# Patient Record
Sex: Male | Born: 1965 | Race: White | Hispanic: No | Marital: Single | Smoking: Current every day smoker
Health system: Southern US, Community
[De-identification: ages and names within clinical notes are randomized; demographics above are authoritative.]

## PROBLEM LIST (undated history)

## (undated) DIAGNOSIS — I2510 Atherosclerotic heart disease of native coronary artery without angina pectoris: Secondary | ICD-10-CM

## (undated) DIAGNOSIS — I10 Essential (primary) hypertension: Secondary | ICD-10-CM

## (undated) DIAGNOSIS — I5030 Unspecified diastolic (congestive) heart failure: Secondary | ICD-10-CM

## (undated) DIAGNOSIS — K219 Gastro-esophageal reflux disease without esophagitis: Secondary | ICD-10-CM

## (undated) DIAGNOSIS — Z720 Tobacco use: Secondary | ICD-10-CM

## (undated) DIAGNOSIS — N183 Chronic kidney disease, stage 3 unspecified: Secondary | ICD-10-CM

## (undated) DIAGNOSIS — E785 Hyperlipidemia, unspecified: Secondary | ICD-10-CM

## (undated) DIAGNOSIS — I213 ST elevation (STEMI) myocardial infarction of unspecified site: Secondary | ICD-10-CM

## (undated) HISTORY — DX: Chronic kidney disease, stage 3 unspecified: N18.30

## (undated) HISTORY — DX: Morbid (severe) obesity due to excess calories: E66.01

## (undated) HISTORY — DX: Atherosclerotic heart disease of native coronary artery without angina pectoris: I25.10

## (undated) HISTORY — DX: Unspecified diastolic (congestive) heart failure: I50.30

## (undated) HISTORY — PX: CARDIAC CATHETERIZATION: SHX172

## (undated) HISTORY — DX: Hyperlipidemia, unspecified: E78.5

## (undated) HISTORY — DX: Chronic kidney disease, stage 3 (moderate): N18.3

---

## 2005-01-07 ENCOUNTER — Ambulatory Visit (HOSPITAL_COMMUNITY): Admission: RE | Admit: 2005-01-07 | Discharge: 2005-01-07 | Payer: Self-pay | Admitting: *Deleted

## 2018-03-24 ENCOUNTER — Other Ambulatory Visit: Payer: Self-pay | Admitting: Nephrology

## 2018-03-24 DIAGNOSIS — N183 Chronic kidney disease, stage 3 unspecified: Secondary | ICD-10-CM

## 2018-03-24 DIAGNOSIS — R809 Proteinuria, unspecified: Secondary | ICD-10-CM

## 2018-03-29 ENCOUNTER — Ambulatory Visit
Admission: RE | Admit: 2018-03-29 | Discharge: 2018-03-29 | Disposition: A | Discharge status: Home / Self Care | Payer: BLUE CROSS/BLUE SHIELD | Source: Ambulatory Visit | Attending: Nephrology | Admitting: Nephrology

## 2018-03-29 DIAGNOSIS — N183 Chronic kidney disease, stage 3 unspecified: Secondary | ICD-10-CM

## 2018-03-29 DIAGNOSIS — R809 Proteinuria, unspecified: Secondary | ICD-10-CM

## 2018-04-04 ENCOUNTER — Encounter (HOSPITAL_COMMUNITY): Payer: Self-pay | Admitting: Physician Assistant

## 2018-04-04 ENCOUNTER — Encounter (HOSPITAL_COMMUNITY): Admission: EM | Disposition: A | Discharge status: Home / Self Care | Payer: Self-pay | Attending: Internal Medicine

## 2018-04-04 ENCOUNTER — Inpatient Hospital Stay (HOSPITAL_COMMUNITY)
Admission: EM | Admit: 2018-04-04 | Discharge: 2018-04-06 | DRG: 247 | Disposition: A | Discharge status: Home / Self Care | Payer: BLUE CROSS/BLUE SHIELD | Attending: Internal Medicine | Admitting: Internal Medicine

## 2018-04-04 DIAGNOSIS — N183 Chronic kidney disease, stage 3 (moderate): Secondary | ICD-10-CM | POA: Diagnosis present

## 2018-04-04 DIAGNOSIS — I213 ST elevation (STEMI) myocardial infarction of unspecified site: Secondary | ICD-10-CM | POA: Diagnosis present

## 2018-04-04 DIAGNOSIS — I252 Old myocardial infarction: Secondary | ICD-10-CM | POA: Diagnosis not present

## 2018-04-04 DIAGNOSIS — E785 Hyperlipidemia, unspecified: Secondary | ICD-10-CM | POA: Diagnosis present

## 2018-04-04 DIAGNOSIS — Z720 Tobacco use: Secondary | ICD-10-CM

## 2018-04-04 DIAGNOSIS — I2102 ST elevation (STEMI) myocardial infarction involving left anterior descending coronary artery: Secondary | ICD-10-CM

## 2018-04-04 DIAGNOSIS — I2129 ST elevation (STEMI) myocardial infarction involving other sites: Principal | ICD-10-CM | POA: Diagnosis present

## 2018-04-04 DIAGNOSIS — Z6841 Body Mass Index (BMI) 40.0 and over, adult: Secondary | ICD-10-CM | POA: Diagnosis not present

## 2018-04-04 DIAGNOSIS — I2510 Atherosclerotic heart disease of native coronary artery without angina pectoris: Secondary | ICD-10-CM | POA: Diagnosis present

## 2018-04-04 DIAGNOSIS — I129 Hypertensive chronic kidney disease with stage 1 through stage 4 chronic kidney disease, or unspecified chronic kidney disease: Secondary | ICD-10-CM | POA: Diagnosis present

## 2018-04-04 DIAGNOSIS — I2109 ST elevation (STEMI) myocardial infarction involving other coronary artery of anterior wall: Secondary | ICD-10-CM | POA: Diagnosis not present

## 2018-04-04 DIAGNOSIS — R079 Chest pain, unspecified: Secondary | ICD-10-CM | POA: Diagnosis present

## 2018-04-04 DIAGNOSIS — I10 Essential (primary) hypertension: Secondary | ICD-10-CM

## 2018-04-04 DIAGNOSIS — Z955 Presence of coronary angioplasty implant and graft: Secondary | ICD-10-CM

## 2018-04-04 DIAGNOSIS — E7800 Pure hypercholesterolemia, unspecified: Secondary | ICD-10-CM | POA: Diagnosis not present

## 2018-04-04 HISTORY — DX: ST elevation (STEMI) myocardial infarction of unspecified site: I21.3

## 2018-04-04 HISTORY — PX: LEFT HEART CATH AND CORONARY ANGIOGRAPHY: CATH118249

## 2018-04-04 HISTORY — DX: Essential (primary) hypertension: I10

## 2018-04-04 HISTORY — DX: Tobacco use: Z72.0

## 2018-04-04 HISTORY — PX: CORONARY/GRAFT ACUTE MI REVASCULARIZATION: CATH118305

## 2018-04-04 HISTORY — DX: Gastro-esophageal reflux disease without esophagitis: K21.9

## 2018-04-04 LAB — CBC
HCT: 50 % (ref 39.0–52.0)
HEMOGLOBIN: 16.3 g/dL (ref 13.0–17.0)
MCH: 26.7 pg (ref 26.0–34.0)
MCHC: 32.6 g/dL (ref 30.0–36.0)
MCV: 81.8 fL (ref 78.0–100.0)
PLATELETS: 145 10*3/uL — AB (ref 150–400)
RBC: 6.11 MIL/uL — AB (ref 4.22–5.81)
RDW: 15.5 % (ref 11.5–15.5)
WBC: 11.3 10*3/uL — ABNORMAL HIGH (ref 4.0–10.5)

## 2018-04-04 LAB — POCT I-STAT, CHEM 8
BUN: 24 mg/dL — ABNORMAL HIGH (ref 6–20)
CHLORIDE: 104 mmol/L (ref 101–111)
Calcium, Ion: 1.19 mmol/L (ref 1.15–1.40)
Creatinine, Ser: 1.4 mg/dL — ABNORMAL HIGH (ref 0.61–1.24)
Glucose, Bld: 120 mg/dL — ABNORMAL HIGH (ref 65–99)
HCT: 53 % — ABNORMAL HIGH (ref 39.0–52.0)
Hemoglobin: 18 g/dL — ABNORMAL HIGH (ref 13.0–17.0)
POTASSIUM: 3.8 mmol/L (ref 3.5–5.1)
SODIUM: 141 mmol/L (ref 135–145)
TCO2: 23 mmol/L (ref 22–32)

## 2018-04-04 LAB — COMPREHENSIVE METABOLIC PANEL
ALBUMIN: 3.8 g/dL (ref 3.5–5.0)
ALK PHOS: 114 U/L (ref 38–126)
ALT: 27 U/L (ref 17–63)
AST: 29 U/L (ref 15–41)
Anion gap: 14 (ref 5–15)
BILIRUBIN TOTAL: 0.5 mg/dL (ref 0.3–1.2)
BUN: 22 mg/dL — AB (ref 6–20)
CALCIUM: 9.1 mg/dL (ref 8.9–10.3)
CO2: 20 mmol/L — AB (ref 22–32)
Chloride: 103 mmol/L (ref 101–111)
Creatinine, Ser: 1.45 mg/dL — ABNORMAL HIGH (ref 0.61–1.24)
GFR calc Af Amer: >60 mL/min (ref >60)
GFR calc non Af Amer: 54 mL/min — ABNORMAL LOW (ref >60)
GLUCOSE: 116 mg/dL — AB (ref 65–99)
Potassium: 3.8 mmol/L (ref 3.5–5.1)
SODIUM: 137 mmol/L (ref 135–145)
TOTAL PROTEIN: 7.3 g/dL (ref 6.5–8.1)

## 2018-04-04 LAB — TROPONIN I: Troponin I: 0.21 ng/mL (ref <0.03)

## 2018-04-04 LAB — POCT ACTIVATED CLOTTING TIME: ACTIVATED CLOTTING TIME: 356 s

## 2018-04-04 LAB — PROTIME-INR
INR: 1.01
PROTHROMBIN TIME: 13.2 s (ref 11.4–15.2)

## 2018-04-04 LAB — HEMOGLOBIN A1C
HEMOGLOBIN A1C: 5.4 % (ref 4.8–5.6)
MEAN PLASMA GLUCOSE: 108.28 mg/dL

## 2018-04-04 LAB — LIPID PANEL
CHOL/HDL RATIO: 4.2 ratio
Cholesterol: 139 mg/dL (ref 0–200)
HDL: 33 mg/dL — ABNORMAL LOW (ref >40)
LDL CALC: 87 mg/dL (ref 0–99)
Triglycerides: 96 mg/dL (ref <150)
VLDL: 19 mg/dL (ref 0–40)

## 2018-04-04 LAB — MRSA PCR SCREENING: MRSA by PCR: NEGATIVE

## 2018-04-04 LAB — APTT: aPTT: 30 seconds (ref 24–36)

## 2018-04-04 SURGERY — LEFT HEART CATH AND CORONARY ANGIOGRAPHY
Anesthesia: LOCAL

## 2018-04-04 MED ORDER — HYDRALAZINE HCL 20 MG/ML IJ SOLN: 5.0000 mg | INTRAMUSCULAR | Status: AC | PRN

## 2018-04-04 MED ORDER — ACETAMINOPHEN 325 MG PO TABS
650.0000 mg | ORAL_TABLET | ORAL | Status: DC | PRN
  Administered 2018-04-04 – 2018-04-05 (×2): 650 mg via ORAL
  Filled 2018-04-04 (×2): qty 2

## 2018-04-04 MED ORDER — TIROFIBAN HCL IN NACL 5-0.9 MG/100ML-% IV SOLN
INTRAVENOUS | Status: AC | PRN
  Administered 2018-04-04 (×2): 0.15 ug/kg/min via INTRAVENOUS

## 2018-04-04 MED ORDER — NITROGLYCERIN 1 MG/10 ML FOR IR/CATH LAB
INTRA_ARTERIAL | Status: DC | PRN
  Administered 2018-04-04: 200 ug via INTRACORONARY

## 2018-04-04 MED ORDER — HEPARIN SODIUM (PORCINE) 1000 UNIT/ML IJ SOLN
INTRAMUSCULAR | Status: AC
  Filled 2018-04-04: qty 1

## 2018-04-04 MED ORDER — ASPIRIN 81 MG PO CHEW
81.0000 mg | CHEWABLE_TABLET | Freq: Every day | ORAL | Status: DC
  Administered 2018-04-05 – 2018-04-06 (×2): 81 mg via ORAL
  Filled 2018-04-04 (×2): qty 1

## 2018-04-04 MED ORDER — VERAPAMIL HCL 2.5 MG/ML IV SOLN
INTRAVENOUS | Status: DC | PRN
  Administered 2018-04-04: 10 mL via INTRA_ARTERIAL

## 2018-04-04 MED ORDER — SODIUM CHLORIDE 0.9 % IV SOLN
INTRAVENOUS | Status: AC
  Administered 2018-04-04: 21:00:00 via INTRAVENOUS

## 2018-04-04 MED ORDER — CARVEDILOL 3.125 MG PO TABS
3.1250 mg | ORAL_TABLET | Freq: Two times a day (BID) | ORAL | Status: DC
  Administered 2018-04-04 – 2018-04-06 (×4): 3.125 mg via ORAL
  Filled 2018-04-04 (×4): qty 1

## 2018-04-04 MED ORDER — CARVEDILOL 3.125 MG PO TABS: 3.1250 mg | ORAL_TABLET | Freq: Two times a day (BID) | ORAL | Status: DC

## 2018-04-04 MED ORDER — HEPARIN SODIUM (PORCINE) 1000 UNIT/ML IJ SOLN
INTRAMUSCULAR | Status: DC | PRN
  Administered 2018-04-04: 5000 [IU] via INTRAVENOUS
  Administered 2018-04-04: 7000 [IU] via INTRAVENOUS

## 2018-04-04 MED ORDER — SODIUM CHLORIDE 0.9 % IV SOLN: 250.0000 mL | INTRAVENOUS | Status: DC | PRN

## 2018-04-04 MED ORDER — FENTANYL CITRATE (PF) 100 MCG/2ML IJ SOLN
INTRAMUSCULAR | Status: DC | PRN
  Administered 2018-04-04: 25 ug via INTRAVENOUS
  Administered 2018-04-04: 50 ug via INTRAVENOUS

## 2018-04-04 MED ORDER — FENTANYL CITRATE (PF) 100 MCG/2ML IJ SOLN
INTRAMUSCULAR | Status: AC
  Filled 2018-04-04: qty 2

## 2018-04-04 MED ORDER — SODIUM CHLORIDE 0.9 % IV SOLN
INTRAVENOUS | Status: AC | PRN
  Administered 2018-04-04: 50 mL/h via INTRAVENOUS

## 2018-04-04 MED ORDER — NITROGLYCERIN IN D5W 200-5 MCG/ML-% IV SOLN
INTRAVENOUS | Status: AC
  Filled 2018-04-04: qty 250

## 2018-04-04 MED ORDER — NITROGLYCERIN 1 MG/10 ML FOR IR/CATH LAB
INTRA_ARTERIAL | Status: AC
  Filled 2018-04-04: qty 10

## 2018-04-04 MED ORDER — TICAGRELOR 90 MG PO TABS
ORAL_TABLET | ORAL | Status: AC
  Filled 2018-04-04: qty 2

## 2018-04-04 MED ORDER — LIDOCAINE HCL (PF) 1 % IJ SOLN
INTRAMUSCULAR | Status: DC | PRN
  Administered 2018-04-04: 2 mL via INTRADERMAL

## 2018-04-04 MED ORDER — HEPARIN (PORCINE) IN NACL 2-0.9 UNIT/ML-% IJ SOLN
INTRAMUSCULAR | Status: AC | PRN
  Administered 2018-04-04 (×3): 500 mL

## 2018-04-04 MED ORDER — ATORVASTATIN CALCIUM 80 MG PO TABS
80.0000 mg | ORAL_TABLET | Freq: Every day | ORAL | Status: DC
  Administered 2018-04-05: 80 mg via ORAL
  Filled 2018-04-04: qty 1

## 2018-04-04 MED ORDER — ONDANSETRON HCL 4 MG/2ML IJ SOLN: 4.0000 mg | Freq: Four times a day (QID) | INTRAMUSCULAR | Status: DC | PRN

## 2018-04-04 MED ORDER — TICAGRELOR 90 MG PO TABS
90.0000 mg | ORAL_TABLET | Freq: Two times a day (BID) | ORAL | Status: DC
  Administered 2018-04-05 – 2018-04-06 (×3): 90 mg via ORAL
  Filled 2018-04-04 (×4): qty 1

## 2018-04-04 MED ORDER — MIDAZOLAM HCL 2 MG/2ML IJ SOLN
INTRAMUSCULAR | Status: AC
  Filled 2018-04-04: qty 2

## 2018-04-04 MED ORDER — HEPARIN SODIUM (PORCINE) 5000 UNIT/ML IJ SOLN
5000.0000 [IU] | Freq: Three times a day (TID) | INTRAMUSCULAR | Status: DC
  Administered 2018-04-04 – 2018-04-06 (×5): 5000 [IU] via SUBCUTANEOUS
  Filled 2018-04-04 (×5): qty 1

## 2018-04-04 MED ORDER — LABETALOL HCL 5 MG/ML IV SOLN: 10.0000 mg | INTRAVENOUS | Status: AC | PRN

## 2018-04-04 MED ORDER — TICAGRELOR 90 MG PO TABS
ORAL_TABLET | ORAL | Status: DC | PRN
  Administered 2018-04-04: 180 mg via ORAL

## 2018-04-04 MED ORDER — HEPARIN (PORCINE) IN NACL 2-0.9 UNIT/ML-% IJ SOLN
INTRAMUSCULAR | Status: AC
  Filled 2018-04-04: qty 1000

## 2018-04-04 MED ORDER — SODIUM CHLORIDE 0.9% FLUSH: 3.0000 mL | INTRAVENOUS | Status: DC | PRN

## 2018-04-04 MED ORDER — IOHEXOL 350 MG/ML SOLN
INTRAVENOUS | Status: DC | PRN
  Administered 2018-04-04: 120 mL via INTRA_ARTERIAL

## 2018-04-04 MED ORDER — MIDAZOLAM HCL 2 MG/2ML IJ SOLN
INTRAMUSCULAR | Status: DC | PRN
  Administered 2018-04-04: 1 mg via INTRAVENOUS

## 2018-04-04 MED ORDER — NITROGLYCERIN IN D5W 200-5 MCG/ML-% IV SOLN: 0.0000 ug/min | INTRAVENOUS | Status: DC

## 2018-04-04 MED ORDER — SODIUM CHLORIDE 0.9% FLUSH
3.0000 mL | Freq: Two times a day (BID) | INTRAVENOUS | Status: DC
  Administered 2018-04-04 – 2018-04-05 (×3): 3 mL via INTRAVENOUS

## 2018-04-04 MED ORDER — NITROGLYCERIN IN D5W 200-5 MCG/ML-% IV SOLN
INTRAVENOUS | Status: AC | PRN
  Administered 2018-04-04: 5 ug/min via INTRAVENOUS

## 2018-04-04 MED ORDER — IOPAMIDOL (ISOVUE-370) INJECTION 76%
INTRAVENOUS | Status: AC
  Filled 2018-04-04: qty 125

## 2018-04-04 MED ORDER — VERAPAMIL HCL 2.5 MG/ML IV SOLN
INTRAVENOUS | Status: AC
  Filled 2018-04-04: qty 2

## 2018-04-04 MED ORDER — TIROFIBAN HCL IN NACL 5-0.9 MG/100ML-% IV SOLN
INTRAVENOUS | Status: AC
  Filled 2018-04-04: qty 100

## 2018-04-04 MED ORDER — TIROFIBAN HCL IN NACL 5-0.9 MG/100ML-% IV SOLN: 0.1500 ug/kg/min | INTRAVENOUS | Status: DC

## 2018-04-04 MED ORDER — TIROFIBAN (AGGRASTAT) BOLUS VIA INFUSION
INTRAVENOUS | Status: DC | PRN
  Administered 2018-04-04: 3345 ug via INTRAVENOUS

## 2018-04-04 MED ORDER — IOPAMIDOL (ISOVUE-370) INJECTION 76%
INTRAVENOUS | Status: DC | PRN
  Administered 2018-04-04: 115 mL via INTRA_ARTERIAL

## 2018-04-04 MED ORDER — OXYCODONE HCL 5 MG PO TABS
5.0000 mg | ORAL_TABLET | ORAL | Status: DC | PRN
  Administered 2018-04-05: 5 mg via ORAL
  Administered 2018-04-05: 10 mg via ORAL
  Administered 2018-04-05: 5 mg via ORAL
  Filled 2018-04-04: qty 2
  Filled 2018-04-04: qty 1
  Filled 2018-04-04: qty 2

## 2018-04-04 MED ORDER — LIDOCAINE HCL (PF) 1 % IJ SOLN
INTRAMUSCULAR | Status: AC
  Filled 2018-04-04: qty 30

## 2018-04-04 SURGICAL SUPPLY — 22 items
BALLN SAPPHIRE 2.0X12 (BALLOONS) ×2
BALLOON SAPPHIRE 2.0X12 (BALLOONS) IMPLANT
BAND CMPR LRG ZPHR (HEMOSTASIS)
BAND ZEPHYR COMPRESS 30 LONG (HEMOSTASIS) IMPLANT
CATH INFINITI 5FR ANG PIGTAIL (CATHETERS) ×1 IMPLANT
CATH INFINITI JR4 5F (CATHETERS) ×1 IMPLANT
CATH VISTA GUIDE 6FR XB3.5 (CATHETERS) ×1 IMPLANT
DEVICE RAD COMP TR BAND LRG (VASCULAR PRODUCTS) ×1 IMPLANT
ELECT DEFIB PAD ADLT CADENCE (PAD) ×1 IMPLANT
GUIDEWIRE INQWIRE 1.5J.035X260 (WIRE) IMPLANT
INQWIRE 1.5J .035X260CM (WIRE) ×2
KIT ENCORE 26 ADVANTAGE (KITS) ×1 IMPLANT
KIT HEART LEFT (KITS) ×2 IMPLANT
NDL PERC 21GX4CM (NEEDLE) IMPLANT
NEEDLE PERC 21GX4CM (NEEDLE) ×2 IMPLANT
PACK CARDIAC CATHETERIZATION (CUSTOM PROCEDURE TRAY) ×2 IMPLANT
SHEATH RAIN RADIAL 21G 6FR (SHEATH) ×1 IMPLANT
STENT RESOLUTE ONYX 2.25X12 (Permanent Stent) ×1 IMPLANT
TRANSDUCER W/STOPCOCK (MISCELLANEOUS) ×2 IMPLANT
TUBING CIL FLEX 10 FLL-RA (TUBING) ×2 IMPLANT
WIRE HI TORQ WHISPER MS 190CM (WIRE) ×1 IMPLANT
WIRE RUNTHROUGH .014X180CM (WIRE) ×1 IMPLANT

## 2018-04-04 NOTE — H&P (Addendum)
Cardiology Admission History and Physical:   Patient ID: [name] Schmidt; MRN: [id number]; DOB: Mar 06, 1966   Admission date: 04/04/2018  Primary Care Provider: Nonnie Done., MD Primary Cardiologist: New to Dr. Okey Dupre  Chief Complaint:  Chest pain   Patient Profile:   [name] Schmidt is a 52 y.o. male with a history of tobacco smoking (35 pack year) and recent L shoulder rotater cuff surgery last Thursday presented with Code STEMI.   History of Present Illness:   [name] with Lateral ST elevation with ST depression in inferior leads. Pain started around 1:45pm. Substernal chest pressure. No radiation. No prior chest pain. + dyspnea. No palpitation or syncope.   No hx of HTN or DM. Recent mild kidney injury.    Past Medical History:  Diagnosis Date  . Tobacco abuse     Medications Prior to Admission: Prior to Admission medications   Not on File     Allergies:   No Known Allergies  Social History:   Social History   Socioeconomic History  . Marital status: Single    Spouse name: Not on file  . Number of children: Not on file  . Years of education: Not on file  . Highest education level: Not on file  Occupational History  . Not on file  Social Needs  . Financial resource strain: Not on file  . Food insecurity:    Worry: Not on file    Inability: Not on file  . Transportation needs:    Medical: Not on file    Non-medical: Not on file  Tobacco Use  . Smoking status: Not on file  Substance and Sexual Activity  . Alcohol use: Not on file  . Drug use: Not on file  . Sexual activity: Not on file  Lifestyle  . Physical activity:    Days per week: Not on file    Minutes per session: Not on file  . Stress: Not on file  Relationships  . Social connections:    Talks on phone: Not on file    Gets together: Not on file    Attends religious service: Not on file    Active member of club or organization: Not on file    Attends meetings of clubs or organizations: Not on  file    Relationship status: Not on file  . Intimate partner violence:    Fear of current or ex partner: Not on file    Emotionally abused: Not on file    Physically abused: Not on file    Forced sexual activity: Not on file  Other Topics Concern  . Not on file  Social History Narrative  . Not on file    Family History:   The patient's family history is not on file.   Denies family hx of CAD  ROS:  Please see the history of present illness.  All other ROS reviewed and negative.     Physical Exam/Data:   Vitals:   04/04/18 1646  Weight: 295 lb (133.8 kg)  Height: 5\' 11"  (1.803 m)   No intake or output data in the 24 hours ending 04/04/18 1648 Filed Weights   04/04/18 1646  Weight: 295 lb (133.8 kg)   Body mass index is 41.14 kg/m.  General:  Well nourished, well developed, in no acute distress HEENT: normal Lymph: no adenopathy Neck: no JVD Endocrine:  No thryomegaly Vascular: No carotid bruits; FA pulses 2+ bilaterally without bruits  Cardiac:  normal S1, S2; RRR; no murmur  Lungs:  clear to auscultation bilaterally, no wheezing, rhonchi or rales  Abd: soft, nontender, no hepatomegaly  Ext: no edema Musculoskeletal:  No deformities, BUE and BLE strength normal and equal Skin: warm and dry  Neuro:  CNs 2-12 intact, no focal abnormalities noted Psych:  Normal affect    EKG:  The ECG that was done  was personally reviewed and demonstrates SR with  Lateral ST elevation with ST depression in inferior leads.  Relevant CV Studies: Pending cath   Laboratory Data:  Radiology/Studies:  No results found.  Assessment and Plan:   1. STEMI Pain started around 1:45pm. Still having 6/10 chest pressure. Pending cath.   2. Tobacco abuse - He needs to quit.   Severity of Illness: The appropriate patient status for this patient is INPATIENT. Inpatient status is judged to be reasonable and necessary in order to provide the required intensity of service to ensure the  patient's safety. The patient's presenting symptoms, physical exam findings, and initial radiographic and laboratory data in the context of their chronic comorbidities is felt to place them at high risk for further clinical deterioration. Furthermore, it is not anticipated that the patient will be medically stable for discharge from the hospital within 2 midnights of admission. The following factors support the patient status of inpatient.   " The patient's presenting symptoms include Chest pain. " The worrisome physical exam findings include none " The initial radiographic and laboratory data are worrisome because of STEMI " The chronic co-morbidities include obesity and tobacco abuse   * I certify that at the point of admission it is my clinical judgment that the patient will require inpatient hospital care spanning beyond 2 midnights from the point of admission due to high intensity of service, high risk for further deterioration and high frequency of surveillance required.*    For questions or updates, please contact [facility] Please consult [email or web address] for contact info under Cardiology/STEMI.    Lorelei Pont, [place]  04/04/2018 4:48 PM   I have seen and examined the patient and agree with the findings and plan, as documented in the PA/NP's note, with the following additions/changes.  [name] is a 52 y/o man with history of tobacco abuse, morbid obesity, chronic kidney disease, and recent left rotator cuff surgery, admitted with chest pain since ~1:30 PM and found to have lateral STEMI by EMS.  Upon arrival at [facility], he complained of continued left-sided chest pain (6/10) without radiation or associated symptoms.  He did not have much improvement with ASA and NTG administered by EMS.  He was taken for emergent LHC, demonstrated occluded diagonal branch that was treated by primary PCI.  Post-PCI, his CP had improved to 1/10.  Exam notable for RRR w/o mumurs.  Clear  lungs.  Mild left shoulder swelling with clean dressing in place.  2+ radial and pedal pulses.  Post-PCI EKG (personally reviewed) shows NSR with poor R wave progression and non-specific ST/T changes.  Lateral ST elevation and inferior ST depressions significantly improved.  In summary, Mr. Keisler is a 53 y/o man admitted with lateral STEMI due to acute plaque rupture and 100% thrombotic occlusion of dominant diagonal (D1) branch.  Tirofiban used during the case but has been discontinued to minimize risk for bleeding at site of recent left shoulder surgery.  Mr. Mccready will need to continue DAPT with ASA and ticagrelor for at least 12 months.  Atorvastatin 80 mg daily has been ordered, as well as carvedilol 3.125 mg  BID. Echo ordered for assessment of LVEF.  We will need to monitor his renal function closedly, given CKD stage III and 235 mL of contrast used during today's procedure. He will need ongoing tobacco cessation counseling and further discussion of lifestyle modifications for management of his morbid obesity.  Yvonne Kendall, MD [facility] Pager: [phone]

## 2018-04-05 ENCOUNTER — Inpatient Hospital Stay (HOSPITAL_COMMUNITY): Payer: BLUE CROSS/BLUE SHIELD

## 2018-04-05 ENCOUNTER — Encounter (HOSPITAL_COMMUNITY): Payer: Self-pay | Admitting: Internal Medicine

## 2018-04-05 ENCOUNTER — Other Ambulatory Visit: Payer: Self-pay

## 2018-04-05 DIAGNOSIS — I2102 ST elevation (STEMI) myocardial infarction involving left anterior descending coronary artery: Secondary | ICD-10-CM

## 2018-04-05 LAB — TROPONIN I: Troponin I: >65 ng/mL (ref <0.03)

## 2018-04-05 LAB — BASIC METABOLIC PANEL
ANION GAP: 11 (ref 5–15)
BUN: 20 mg/dL (ref 6–20)
CALCIUM: 9 mg/dL (ref 8.9–10.3)
CO2: 23 mmol/L (ref 22–32)
Chloride: 103 mmol/L (ref 101–111)
Creatinine, Ser: 1.27 mg/dL — ABNORMAL HIGH (ref 0.61–1.24)
GFR calc Af Amer: >60 mL/min (ref >60)
GLUCOSE: 114 mg/dL — AB (ref 65–99)
Potassium: 4.1 mmol/L (ref 3.5–5.1)
Sodium: 137 mmol/L (ref 135–145)

## 2018-04-05 LAB — CBC
HCT: 46.6 % (ref 39.0–52.0)
HEMOGLOBIN: 14.9 g/dL (ref 13.0–17.0)
MCH: 26.1 pg (ref 26.0–34.0)
MCHC: 32 g/dL (ref 30.0–36.0)
MCV: 81.8 fL (ref 78.0–100.0)
Platelets: 154 10*3/uL (ref 150–400)
RBC: 5.7 MIL/uL (ref 4.22–5.81)
RDW: 15.8 % — AB (ref 11.5–15.5)
WBC: 12.6 10*3/uL — ABNORMAL HIGH (ref 4.0–10.5)

## 2018-04-05 LAB — ECHOCARDIOGRAM COMPLETE
HEIGHTINCHES: 71 in
Weight: 4720 oz

## 2018-04-05 MED ORDER — PANTOPRAZOLE SODIUM 40 MG PO TBEC
40.0000 mg | DELAYED_RELEASE_TABLET | Freq: Every day | ORAL | Status: DC
  Administered 2018-04-05 – 2018-04-06 (×2): 40 mg via ORAL
  Filled 2018-04-05 (×2): qty 1

## 2018-04-05 NOTE — Care Management Note (Signed)
Case Management Note Donn Pierini RN, BSN Unit 4E-Case Manager [phone]  Patient Details  Name: Lyriq Marcia MRN: [id number] Date of Birth: 1966-08-10  Subjective/Objective:  Pt admitted with STEMI s/p DES                   Action/Plan: PTA pt lived at home, independent- referral received for Brilinta needs- per insurance check copay cost $91.58- spoke with pt at bedside- coverage info shared, pt provided with copay assist card to use on discharge- per pt [facility] [place]. No further CM needs noted at this time- anticipate return home.   Expected Discharge Date:                  Expected Discharge Plan:  Home/Self Care  In-House Referral:  NA  Discharge planning Services  CM Consult, Medication Assistance  Post Acute Care Choice:  NA Choice offered to:  NA  DME Arranged:    DME Agency:     HH Arranged:    HH Agency:     Status of Service:  Completed  If discussed at Long Length of Stay Meetings, dates discussed:    Discharge Disposition: home/self care   Additional Comments:  Darrold Span, RN 04/05/2018, 4:08 PM

## 2018-04-05 NOTE — Progress Notes (Signed)
Per insurance check for Brilinta  # 9. S/W LATRELL  @ [facility] # [phone]    BRILINTA 90 MG BID  COVER- YES  CO-PAY- $ 91.58  TIER- 2 DRUG  PRIOR APPROVAL- NO  NO DEDUCTIBLE   PREFERRED [facility] : [facility]   FIRST TWO REFILL @ 30 DAY SUPPLY. NEXT REFILL MUST BE 90 DAYS SUPPLY @ RETAIL OR MAIL ORDER

## 2018-04-05 NOTE — Progress Notes (Addendum)
Progress Note  Patient Name: [name] Schmidt Date of Encounter: 04/05/2018  Primary Cardiologist: No primary care provider on file.  Subjective   Feeling well this morning. No chest pain.   Inpatient Medications    Scheduled Meds: . aspirin  81 mg Oral Daily  . atorvastatin  80 mg Oral q1800  . carvedilol  3.125 mg Oral BID WC  . heparin  5,000 Units Subcutaneous Q8H  . sodium chloride flush  3 mL Intravenous Q12H  . ticagrelor  90 mg Oral BID   Continuous Infusions: . sodium chloride    . nitroGLYCERIN Stopped (04/05/18 0400)   PRN Meds: sodium chloride, acetaminophen, ondansetron (ZOFRAN) IV, oxyCODONE, sodium chloride flush   Vital Signs    Vitals:   04/05/18 0330 04/05/18 0400 04/05/18 0406 04/05/18 0430  BP:  119/82    Pulse: 86 86  85  Resp: 18 17  19   Temp:   98.8 F (37.1 C)   TempSrc:   Oral   SpO2: 95% 95%  96%  Weight:      Height:        Intake/Output Summary (Last 24 hours) at 04/05/2018 0758 Last data filed at 04/05/2018 0400 Gross per 24 hour  Intake 454.87 ml  Output 1000 ml  Net -545.13 ml   Filed Weights   04/04/18 1646  Weight: 295 lb (133.8 kg)    Telemetry    SR with few short beats of NSVT - Personally Reviewed  ECG    SR with slight ST elevation in Lead I- Personally Reviewed  Physical Exam   General: Well developed, well nourished, obese older male appearing in no acute distress. Head: Normocephalic, atraumatic.  Neck: Supple without bruits, JVD. Lungs:  Resp regular and unlabored, CTA. Heart: RRR, S1, S2, no S3, S4, or murmur; no rub. Abdomen: Soft, non-tender, non-distended with normoactive bowel sounds. No hepatomegaly. No rebound/guarding. No obvious abdominal masses. Extremities: No clubbing, cyanosis, edema. Distal pedal pulses are 2+ bilaterally. Right radial cath site stable.  Neuro: Alert and oriented X 3. Moves all extremities spontaneously. Psych: Normal affect.  Labs    Chemistry Recent Labs  Lab  04/04/18 1655 04/04/18 1658 04/05/18 0526  NA 141 137 137  K 3.8 3.8 4.1  CL 104 103 103  CO2  --  20* 23  GLUCOSE 120* 116* 114*  BUN 24* 22* 20  CREATININE 1.40* 1.45* 1.27*  CALCIUM  --  9.1 9.0  PROT  --  7.3  --   ALBUMIN  --  3.8  --   AST  --  29  --   ALT  --  27  --   ALKPHOS  --  114  --   BILITOT  --  0.5  --   GFRNONAA  --  54* >60  GFRAA  --  >60 >60  ANIONGAP  --  14 11     Hematology Recent Labs  Lab 04/04/18 1655 04/04/18 1658 04/05/18 0526  WBC  --  11.3* 12.6*  RBC  --  6.11* 5.70  HGB 18.0* 16.3 14.9  HCT 53.0* 50.0 46.6  MCV  --  81.8 81.8  MCH  --  26.7 26.1  MCHC  --  32.6 32.0  RDW  --  15.5 15.8*  PLT  --  145* 154    Cardiac Enzymes Recent Labs  Lab 04/04/18 1658 04/04/18 2342 04/05/18 0526  TROPONINI 0.21* >65.00* >65.00*   No results for input(s): TROPIPOC in the last 168  hours.   BNPNo results for input(s): BNP, PROBNP in the last 168 hours.   DDimer No results for input(s): DDIMER in the last 168 hours.    Radiology    No results found.  Cardiac Studies   Cath: 04/04/18  Conclusion   Conclusions: 1. Lateral STEMI due to acute plaque rupture and 100% thrombotic occlusion of D1. 2. Moderate, non-obstructive coronary artery disease involving the proximal and mid LAD. 3. Mildly elevated left ventricular filling pressure.  Recommendations: 1. Dual antiplatelet therapy with aspirin and ticagrelor for at least 12 months. 2. Aggressive secondary prevention, including high-intensity statin therapy. 3. Obtain echocardiogram to evaluate LVEF.  Yvonne Kendall, MD   TTE: pending  Patient Profile     52 y.o. male with a history of tobacco smoking (35 pack year) and recent L shoulder rotater cuff surgery last Thursday presented with Code STEMI.   Assessment & Plan    1. STEMI: Underwent cath yesterday with Dr. Okey Dupre noted above with acute plaque rupture of 100% thrombus of D1 with successful PCI/DES x1. Plan for DAPT  with ASA/Brilinta for at least one year. Trop peaked at >65 x2. Has some mild chest discomfort over night but feels well this morning. Planned to work with cardiac rehab this morning. Likely transfer to telemetry today.  -- echo pending -- will continue to cycle troponins -- continue ASA, statin, BB, and Brilinta  2. Tobacco use: Reports he is ready to quit.  3. HL: LDL above goal at 87. On high dose statin.   Signed, Laverda Page, NP  04/05/2018, 7:58 AM  Pager # [phone]   For questions or updates, please contact [facility] Please consult [email or web address] for contact info under Cardiology/STEMI.    Agree with note by Laverda Page NP-C  Postoperative #1 high lateral infarct secondary to a thrombotic occluded large second diagonal branch. The remainder of his coronary anatomy was free of significant disease. 2-D echo is pending for LV function. His troponin was greater than 65. His remaining hemodynamically stable. Blood pressure is moderately elevated. His right wrist appears stable and is exam otherwise is okay. He is on optimal post MI pharmacology" including dual antiplatelet therapy with Proventil, high-dose statin therapy and low-dose beta blocker which will be titrated based on vital signs. We may initiate an ace inhibitor as well. Okay to transfer to floor today. Cardiac rehabilitation with potential discharge in the next 24-48 hours.  Runell Gess, M.D., FACP, [facility], [facility] [address] [place], [place]  [address]  [phone] 04/05/2018 9:00 AM

## 2018-04-05 NOTE — Progress Notes (Signed)
CARDIAC REHAB PHASE I   PRE:  Rate/Rhythm: 90 SR  BP:  Supine:   Sitting: 150/97  Standing:    SaO2: 98%RA  MODE:  Ambulation: 400 ft   POST:  Rate/Rhythm: 106 ST  BP:  Supine:   Sitting: 159/96  Standing:    SaO2: 99%RA 1430-1530 Pt walked 400 ft with steady gait and no CP. Tolerated well. MI education completed with pt who voiced understanding. Stressed importance of brilinta with stent. Needs to see case manager. Reviewed MI restrictions, smoking cessation, ex ed, heart healthy food choices, NTG use, CRP 2. Referred to [place] program. Gave fake cigarette and smoking cessation handout.. Pt knows cannot start CRP 2 until shoulder is healed.    Luetta Nutting, RN BSN  04/05/2018 3:25 PM

## 2018-04-05 NOTE — Progress Notes (Signed)
  Echocardiogram 2D Echocardiogram has been performed.  [name] Schmidt 04/05/2018, 2:14 PM

## 2018-04-06 ENCOUNTER — Encounter (HOSPITAL_COMMUNITY): Payer: Self-pay | Admitting: Cardiology

## 2018-04-06 ENCOUNTER — Telehealth: Payer: Self-pay | Admitting: *Deleted

## 2018-04-06 DIAGNOSIS — I10 Essential (primary) hypertension: Secondary | ICD-10-CM

## 2018-04-06 DIAGNOSIS — E7800 Pure hypercholesterolemia, unspecified: Secondary | ICD-10-CM

## 2018-04-06 DIAGNOSIS — Z720 Tobacco use: Secondary | ICD-10-CM

## 2018-04-06 DIAGNOSIS — I2109 ST elevation (STEMI) myocardial infarction involving other coronary artery of anterior wall: Secondary | ICD-10-CM

## 2018-04-06 DIAGNOSIS — E785 Hyperlipidemia, unspecified: Secondary | ICD-10-CM

## 2018-04-06 LAB — CBC
HCT: 52.5 % — ABNORMAL HIGH (ref 39.0–52.0)
Hemoglobin: 16.7 g/dL (ref 13.0–17.0)
MCH: 26.4 pg (ref 26.0–34.0)
MCHC: 31.8 g/dL (ref 30.0–36.0)
MCV: 82.9 fL (ref 78.0–100.0)
PLATELETS: 163 10*3/uL (ref 150–400)
RBC: 6.33 MIL/uL — AB (ref 4.22–5.81)
RDW: 16.1 % — ABNORMAL HIGH (ref 11.5–15.5)
WBC: 9.7 10*3/uL (ref 4.0–10.5)

## 2018-04-06 LAB — BASIC METABOLIC PANEL
ANION GAP: 12 (ref 5–15)
BUN: 18 mg/dL (ref 6–20)
CHLORIDE: 102 mmol/L (ref 101–111)
CO2: 23 mmol/L (ref 22–32)
CREATININE: 1.43 mg/dL — AB (ref 0.61–1.24)
Calcium: 9.4 mg/dL (ref 8.9–10.3)
GFR calc non Af Amer: 55 mL/min — ABNORMAL LOW (ref >60)
Glucose, Bld: 110 mg/dL — ABNORMAL HIGH (ref 65–99)
Potassium: 4 mmol/L (ref 3.5–5.1)
SODIUM: 137 mmol/L (ref 135–145)

## 2018-04-06 MED ORDER — TICAGRELOR 90 MG PO TABS: 90.0000 mg | ORAL_TABLET | Freq: Two times a day (BID) | ORAL | 2 refills | Status: DC

## 2018-04-06 MED ORDER — NITROGLYCERIN 0.4 MG SL SUBL: 0.4000 mg | SUBLINGUAL_TABLET | SUBLINGUAL | 2 refills | Status: DC | PRN

## 2018-04-06 MED ORDER — ASPIRIN 81 MG PO CHEW: 81.0000 mg | CHEWABLE_TABLET | Freq: Every day | ORAL | Status: DC

## 2018-04-06 MED ORDER — CARVEDILOL 3.125 MG PO TABS: 3.1250 mg | ORAL_TABLET | Freq: Two times a day (BID) | ORAL | 2 refills | Status: DC

## 2018-04-06 MED ORDER — ATORVASTATIN CALCIUM 80 MG PO TABS: 80.0000 mg | ORAL_TABLET | Freq: Every day | ORAL | 1 refills | Status: DC

## 2018-04-06 NOTE — Discharge Summary (Addendum)
Discharge Summary    Patient ID: [name] Schmidt,  MRN: [id number], DOB/AGE: [birth year] 52 y.o.  Admit date: 04/04/2018 Discharge date: 04/06/2018  Primary Care Provider: Nonnie Done Primary Cardiologist: Dr. Okey Dupre   Discharge Diagnoses    Principal Problem:   STEMI (ST elevation myocardial infarction) [facility]) Active Problems:   Tobacco use   Hypertension   Hyperlipidemia   Allergies No Known Allergies  Diagnostic Studies/Procedures    Cath: 04/04/18  Conclusion   Conclusions: 1. Lateral STEMI due to acute plaque rupture and 100% thrombotic occlusion of D1. 2. Moderate, non-obstructive coronary artery disease involving the proximal and mid LAD. 3. Mildly elevated left ventricular filling pressure.  Recommendations: 1. Dual antiplatelet therapy with aspirin and ticagrelor for at least 12 months. 2. Aggressive secondary prevention, including high-intensity statin therapy. 3. Obtain echocardiogram to evaluate LVEF.  Yvonne Kendall, MD   TTE: 04/05/18  Study Conclusions  - Left ventricle: The cavity size was normal. There was moderate   concentric hypertrophy. The study is not technically sufficient   to allow evaluation of LV diastolic function. - Left atrium: The atrium was mildly dilated. - Right ventricle: The cavity size was mildly dilated. Wall   thickness was normal.  Recommendations:  Limited definity contrast study to accurately assess EF and wall motion. _____________   History of Present Illness     52 yo male with PMH of tobacco use who presented with sudden onset of chest pain and called a Code STEMI in the field. Brought directly to the cath lab for emergent cath.   Hospital Course     Underwent emergent cath with Dr. Okey Dupre noted above with acute plaque rupture and 100% thrombotic occlusion of large D1 vessel. Successful PCI/DES x1. Placed on DAPT with ASA/Brilinta. Trop peaked at >65 x3. No recurrent chest pain. Also placed on coreg  3.125mg  BID with HR and BP tolerating. LDL 87 and placed on high dose statin. Hgb A1c 5.4. Worked well with cardiac rehab without recurrent pain. Did have follow up echo that showed moderate concentric hypertrophy but not sufficient to assess LV function. Will plan for repeat echo as outpatient.   [name] Schmidt was seen by Dr. Allyson Sabal and determined stable for discharge home. Follow up in the office has been arranged. Medications are listed below.   _____________  Discharge Vitals Blood pressure (!) 124/91, pulse 90, temperature 98.5 F (36.9 C), temperature source Oral, resp. rate 17, height 5\' 11"  (1.803 m), weight 291 lb 9.6 oz (132.3 kg), SpO2 98 %.  Filed Weights   04/04/18 1646 04/06/18 0426  Weight: 295 lb (133.8 kg) 291 lb 9.6 oz (132.3 kg)    Labs & Radiologic Studies    CBC Recent Labs    04/05/18 0526 04/06/18 0943  WBC 12.6* 9.7  HGB 14.9 16.7  HCT 46.6 52.5*  MCV 81.8 82.9  PLT 154 163   Basic Metabolic Panel Recent Labs    40/98/11 0526 04/06/18 0943  NA 137 137  K 4.1 4.0  CL 103 102  CO2 23 23  GLUCOSE 114* 110*  BUN 20 18  CREATININE 1.27* 1.43*  CALCIUM 9.0 9.4   Liver Function Tests Recent Labs    04/04/18 1658  AST 29  ALT 27  ALKPHOS 114  BILITOT 0.5  PROT 7.3  ALBUMIN 3.8   No results for input(s): LIPASE, AMYLASE in the last 72 hours. Cardiac Enzymes Recent Labs    04/04/18 2342 04/05/18 0526 04/05/18 1043  TROPONINI >  65.00* >65.00* >65.00*   BNP Invalid input(s): POCBNP D-Dimer No results for input(s): DDIMER in the last 72 hours. Hemoglobin A1C Recent Labs    04/04/18 1658  HGBA1C 5.4   Fasting Lipid Panel Recent Labs    04/04/18 1658  CHOL 139  HDL 33*  LDLCALC 87  TRIG 96  CHOLHDL 4.2   Thyroid Function Tests No results for input(s): TSH, T4TOTAL, T3FREE, THYROIDAB in the last 72 hours.  Invalid input(s): FREET3 _____________  US Renal  Result Date: 03/30/2018 CLINICAL DATA:  Chronic kidney disease stage  3, microscopic hematuria. EXAM: RENAL / URINARY TRACT ULTRASOUND COMPLETE COMPARISON:  01/11/2015. FINDINGS: Right Kidney: Length: 12.5 cm. Echogenicity within normal limits. No mass or hydronephrosis visualized. Left Kidney: Length: 12.6 cm. Echogenicity within normal limits. No mass or hydronephrosis visualized. Bladder: Appears normal for degree of bladder distention. IMPRESSION: Negative. Electronically Signed   By: Leanna Battles M.D.   On: 03/30/2018 09:40   Disposition   Pt is being discharged home today in good condition.  Follow-up Plans & Appointments    Follow-up Information    End, Cristal Deer, MD Follow up on 04/27/2018.   Specialty:  Cardiology Why:  at 10:20am for your follow up appt.  Contact information: [address] [place] [address] [phone]          Discharge Instructions    Amb Referral to Cardiac Rehabilitation   Complete by:  As directed    Diagnosis:   STEMI Coronary Stents     Diet - low sodium heart healthy   Complete by:  As directed    Discharge instructions   Complete by:  As directed    Radial Site Care Refer to this sheet in the next few weeks. These instructions provide you with information on caring for yourself after your procedure. Your caregiver may also give you more specific instructions. Your treatment has been planned according to current medical practices, but problems sometimes occur. Call your caregiver if you have any problems or questions after your procedure. HOME CARE INSTRUCTIONS You may shower the day after the procedure.Remove the bandage (dressing) and gently wash the site with plain soap and water.Gently pat the site dry.  Do not apply powder or lotion to the site.  Do not submerge the affected site in water for 3 to 5 days.  Inspect the site at least twice daily.  Do not flex or bend the affected arm for 24 hours.  No lifting over 5 pounds (2.3 kg) for 5 days after your procedure.  Do not drive home  if you are discharged the same day of the procedure. Have someone else drive you.  You may drive 24 hours after the procedure unless otherwise instructed by your caregiver.  What to expect: Any bruising will usually fade within 1 to 2 weeks.  Blood that collects in the tissue (hematoma) may be painful to the touch. It should usually decrease in size and tenderness within 1 to 2 weeks.  SEEK IMMEDIATE MEDICAL CARE IF: You have unusual pain at the radial site.  You have redness, warmth, swelling, or pain at the radial site.  You have drainage (other than a small amount of blood on the dressing).  You have chills.  You have a fever or persistent symptoms for more than 72 hours.  You have a fever and your symptoms suddenly get worse.  Your arm becomes pale, cool, tingly, or numb.  You have heavy bleeding from the site.  Hold pressure on the site.   PLEASE DO NOT MISS ANY DOSES OF YOUR BRILINTA!!!!! Also keep a log of you blood pressures and bring back to your follow up appt. Please call the office with any questions.   Patients taking blood thinners should generally stay away from medicines like ibuprofen, Advil, Motrin, naproxen, and Aleve due to risk of stomach bleeding. You may take Tylenol as directed or talk to your primary doctor about alternatives.   Increase activity slowly   Complete by:  As directed       Discharge Medications     Medication List    TAKE these medications   aspirin 81 MG chewable tablet Chew 1 tablet (81 mg total) by mouth daily. Start taking on:  04/07/2018   atorvastatin 80 MG tablet Commonly known as:  LIPITOR Take 1 tablet (80 mg total) by mouth daily at 6 PM.   carvedilol 3.125 MG tablet Commonly known as:  COREG Take 1 tablet (3.125 mg total) by mouth 2 (two) times daily with a meal.   nitroGLYCERIN 0.4 MG SL tablet Commonly known as:  NITROSTAT Place 1 tablet (0.4 mg total) under the tongue every 5 (five) minutes as needed.   omeprazole 40 MG  capsule Commonly known as:  PRILOSEC Take 40 mg by mouth daily.   oxyCODONE 5 MG immediate release tablet Commonly known as:  Oxy IR/ROXICODONE Take 5 mg by mouth every 4 (four) hours as needed for pain.   tamsulosin 0.4 MG Caps capsule Commonly known as:  FLOMAX Take 0.4 mg by mouth daily.   ticagrelor 90 MG Tabs tablet Commonly known as:  BRILINTA Take 1 tablet (90 mg total) by mouth 2 (two) times daily.        Aspirin prescribed at discharge?  Yes High Intensity Statin Prescribed? (Lipitor 40-80mg  or Crestor 20-40mg ): Yes Beta Blocker Prescribed? Yes For EF <40%, was ACEI/ARB Prescribed? No:  ADP Receptor Inhibitor Prescribed? (i.e. Plavix etc.-Includes Medically Managed Patients): Yes For EF <40%, Aldosterone Inhibitor Prescribed? No:  Was EF assessed during THIS hospitalization? No will need repeat echo as outpatient Was Cardiac Rehab II ordered? (Included Medically managed Patients): Yes   Outstanding Labs/Studies   FLP/LFTS in 6 weeks if tolerating statin.   Duration of Discharge Encounter   Greater than 30 minutes including physician time.  Signed, Laverda Page NP-C 04/06/2018, 10:58 AM   Agree with note by Laverda Page NP-C  OK for DC home. POD # 2 antero lateral STEMI Rx with PCI/DES mod sized D2 . High trop. No further Sx. Exam benign. 2D echo sub optimal. Will repeat as an OP with definity. ROV with Dr End.  Runell Gess, M.D., FACP, [facility], [facility] [address] [place], [place]  [address]  [phone] 04/06/2018 12:31 PM

## 2018-04-06 NOTE — Telephone Encounter (Signed)
.  tcm  TCM....  Patient is being discharged   They saw Dr. Okey Dupre   They are scheduled to see Dr. Okey Dupre on 04/27/18  They were seen for Stemi   They need to be seen within  7- 10 days   Pt add on wait list   Please call

## 2018-04-06 NOTE — Progress Notes (Signed)
CARDIAC REHAB PHASE I   PRE:  Rate/Rhythm: 90 SR  BP:  Supine:   Sitting: 135/84  Standing:    SaO2: 96%RA  MODE:  Ambulation: 700 ft   POST:  Rate/Rhythm: 94 SR  BP:  Supine:   Sitting: 133/82  Standing:    SaO2: 98%RA 1108-1128 Pt walked 700 ft on RA with steady gait and no CP. Tolerated well. No questions re ed done yesterday.   Luetta Nutting, RN BSN  04/06/2018 11:25 AM

## 2018-04-06 NOTE — Telephone Encounter (Signed)
The patient is currently admitted.

## 2018-04-06 NOTE — Progress Notes (Signed)
Discussed with the patient and all questioned fully answered. He will call me if any problems arise.  Pt given paper Rx for Brilinta 30 day supply per Mardella Layman NP. Pt educated on post-cath preventions and usage of R arm. Pt's aunt and uncle are going to pick him up around 12:30pm.  Tele removed. IV removed.   Leonidas Romberg, RN

## 2018-04-07 NOTE — Telephone Encounter (Signed)
No answer. Left message to call back.   

## 2018-04-07 NOTE — Telephone Encounter (Signed)
Patient contacted regarding discharge from [facility] on 04/06/18.   Patient understands to follow up with provider ? On 04/27/18 at 10:20 AM at [facility].  Patient understands discharge instructions? Yes  Patient understands medications and regiment? Yes   Patient understands to bring all medications to this visit? Yes

## 2018-04-08 ENCOUNTER — Telehealth (HOSPITAL_COMMUNITY): Payer: Self-pay

## 2018-04-08 NOTE — Telephone Encounter (Signed)
Patients insurance is active and benefits verified through [place] - No co-pay, deductible amount of $200.00/$200.00 has been met, out of pocket amount of $500.00/$233.28 has been met, 20% co-insurance, and no pre-authorization is required. Passport/reference [id number]  Will contact patient to see if he is interested in the Cardiac Rehab Program. If interested, patient will need to complete follow up appt. Once completed, patient will be contact for scheduling upon review by the RN Navigator.

## 2018-04-08 NOTE — Telephone Encounter (Signed)
Called patient to see if he is interested in the Cardiac Rehab Program. Patient stated he is not interested in the program. Closed referral. °

## 2018-04-27 ENCOUNTER — Encounter: Payer: Self-pay | Admitting: Internal Medicine

## 2018-04-27 ENCOUNTER — Ambulatory Visit: Payer: BLUE CROSS/BLUE SHIELD | Admitting: Internal Medicine

## 2018-04-27 VITALS — BP 112/80 | HR 81 | Ht 71.0 in | Wt 289.5 lb

## 2018-04-27 DIAGNOSIS — N183 Chronic kidney disease, stage 3 unspecified: Secondary | ICD-10-CM

## 2018-04-27 DIAGNOSIS — Z79899 Other long term (current) drug therapy: Secondary | ICD-10-CM | POA: Diagnosis not present

## 2018-04-27 DIAGNOSIS — Z720 Tobacco use: Secondary | ICD-10-CM

## 2018-04-27 DIAGNOSIS — I2129 ST elevation (STEMI) myocardial infarction involving other sites: Secondary | ICD-10-CM | POA: Diagnosis not present

## 2018-04-27 DIAGNOSIS — E785 Hyperlipidemia, unspecified: Secondary | ICD-10-CM | POA: Diagnosis not present

## 2018-04-27 NOTE — Progress Notes (Signed)
Follow-up Outpatient Visit Date: 04/27/2018  Primary Care Provider: Nonnie Done., MD [address] [facility] [place] [address]  Chief Complaint: Follow-up coronary artery disease  HPI:  [name] Schmidt is a 51 y.o. year-old male with history of coronary artery disease with lateral STEMI on 04/04/2018, who presents for follow-up of coronary artery disease.  He presented with acute onset of chest pain last month, which had been preceded by left rotator cuff surgery less than a week earlier.  EKG showed lateral ST elevation, prompting referral for emergent cardiac catheterization and primary PCI to occluded diagonal branch.  Today, [name] Schmidt reports that he has been feeling well.  He has not had any further episodes of chest pain since leaving the hospital last month.  He denies shortness of breath and edema.  He notes occasional brief palpitation lasting 1 to 2 seconds without any associated symptoms.  He has yet to return to work, as he is also recovering from left shoulder surgery shortly before his STEMI.  He has not been cleared to go back to work by his orthopedist.  [name] Schmidt is tolerating dual antiplatelet therapy with aspirin and ticagrelor well.  He does not think that he will be able to participate in cardiac rehab due to his work schedule.  However, he is trying to walk on a regular basis.  He has also cut down on his tobacco use, now smoking less than 1/2 pack/day.  --------------------------------------------------------------------------------------------------  Past Medical History:  Diagnosis Date  . GERD (gastroesophageal reflux disease)   . Hypertension   . STEMI (ST elevation myocardial infarction) (HCC)    4/19 PCI/DES x1 to D1  . Tobacco abuse    Past Surgical History:  Procedure Laterality Date  . CORONARY/GRAFT ACUTE MI REVASCULARIZATION N/A 04/04/2018   Procedure: Coronary/Graft Acute MI Revascularization;  Surgeon: Yvonne Kendall, MD;  Location: [facility] INVASIVE CV LAB;   Service: Cardiovascular;  Laterality: N/A;  . LEFT HEART CATH AND CORONARY ANGIOGRAPHY N/A 04/04/2018   Procedure: LEFT HEART CATH AND CORONARY ANGIOGRAPHY;  Surgeon: Yvonne Kendall, MD;  Location: [facility] INVASIVE CV LAB;  Service: Cardiovascular;  Laterality: N/A;    Recent CV Pertinent Labs: Lab Results  Component Value Date   CHOL 139 04/04/2018   HDL 33 (L) 04/04/2018   LDLCALC 87 04/04/2018   TRIG 96 04/04/2018   CHOLHDL 4.2 04/04/2018   INR 1.01 04/04/2018   K 4.0 04/06/2018   BUN 18 04/06/2018   CREATININE 1.43 (H) 04/06/2018    Past medical and surgical history were reviewed and updated in EPIC.  Current Meds  Medication Sig  . aspirin 81 MG chewable tablet Chew 1 tablet (81 mg total) by mouth daily.  [name] atorvastatin (LIPITOR) 80 MG tablet Take 1 tablet (80 mg total) by mouth daily at 6 PM.  . carvedilol (COREG) 3.125 MG tablet Take 1 tablet (3.125 mg total) by mouth 2 (two) times daily with a meal.  . nitroGLYCERIN (NITROSTAT) 0.4 MG SL tablet Place 1 tablet (0.4 mg total) under the tongue every 5 (five) minutes as needed.  [name] omeprazole (PRILOSEC) 40 MG capsule Take 40 mg by mouth daily.  [name] oxyCODONE (OXY IR/ROXICODONE) 5 MG immediate release tablet Take 5 mg by mouth every 4 (four) hours as needed for pain.  . tamsulosin (FLOMAX) 0.4 MG CAPS capsule Take 0.4 mg by mouth daily.  . ticagrelor (BRILINTA) 90 MG TABS tablet Take 1 tablet (90 mg total) by mouth 2 (two) times daily.  Allergies: Patient has no known allergies.  Social History   Tobacco Use  . Smoking status: Current Every Day Smoker    Packs/day: 0.50    Types: Cigarettes    Last attempt to quit: 04/05/2018    Years since quitting: 0.0  . Smokeless tobacco: Never Used  Substance Use Topics  . Alcohol use: Not Currently  . Drug use: Never    History reviewed. No pertinent family history.  Review of Systems: A 12-system review of systems was performed and was negative except as noted in the  HPI.  --------------------------------------------------------------------------------------------------  Physical Exam: BP 112/80 (BP Location: Left Arm, Patient Position: Sitting, Cuff Size: Large)   Pulse 81   Ht 5\' 11"  (1.803 m)   Wt 289 lb 8 oz (131.3 kg)   BMI 40.38 kg/m   General: Obese man, seated comfortably in the exam room. HEENT: No conjunctival pallor or scleral icterus. Moist mucous membranes.  OP clear. Neck: Supple without lymphadenopathy, thyromegaly, JVD, or HJR, though evaluation is limited by body habitus. Lungs: Normal work of breathing. Clear to auscultation bilaterally without wheezes or crackles. Heart: Regular rate and rhythm without murmurs, rubs, or gallops.  Unable to assess PMI due to body habitus. Abd: Bowel sounds present. Soft, NT/ND.  Unable to assess HSM due to body habitus. Ext: No lower extremity edema. Radial, PT, and DP pulses are 2+ bilaterally.  Right radial arteriotomy is well-healed. Skin: Warm and dry without rash.  EKG: Normal sinus rhythm with poor R wave progression in V1 and V2, lateral T wave inversions, nonspecific ST changes.  Lab Results  Component Value Date   WBC 9.7 04/06/2018   HGB 16.7 04/06/2018   HCT 52.5 (H) 04/06/2018   MCV 82.9 04/06/2018   PLT 163 04/06/2018    Lab Results  Component Value Date   NA 137 04/06/2018   K 4.0 04/06/2018   CL 102 04/06/2018   CO2 23 04/06/2018   BUN 18 04/06/2018   CREATININE 1.43 (H) 04/06/2018   GLUCOSE 110 (H) 04/06/2018   ALT 27 04/04/2018    Lab Results  Component Value Date   CHOL 139 04/04/2018   HDL 33 (L) 04/04/2018   LDLCALC 87 04/04/2018   TRIG 96 04/04/2018   CHOLHDL 4.2 04/04/2018    --------------------------------------------------------------------------------------------------  ASSESSMENT AND PLAN: Lateral STEMI [name] Schmidt has recovered well from his STEMI with occluded diagonal branch last month.  EKG today shows changes of evolving MI.  [name] Schmidt has  not had any further chest pain.  He is tolerating dual antiplatelet and high intensity statin therapy well.  We will plan to continue least 12 months of DAPT and high intensity statin therapy.  [name] Schmidt is not able to participate in cardiac rehab due to his work schedule.  I encouraged him to continue walking and to lose weight.  Hyperlipidemia Goal LDL less than 70.  We will continue atorvastatin 80 mg daily and plan to repeat a lipid panel and ALT in 2 months.  Chronic kidney disease stage III Mildly elevated creatinine noted during recent hospitalization.  I will defer ongoing monitoring and management to [name] Schmidt PCP.  Tobacco abuse [name] Schmidt has cut down but continues to smoke.  I encouraged tobacco abstinence.  Morbid obesity BMI greater than 40 with significant comorbidities.  Weight loss through diet and exercise encouraged.  Follow-up: Return to clinic in 3 months.  Yvonne Kendall, MD 04/27/2018 10:32 AM

## 2018-04-27 NOTE — Patient Instructions (Signed)
Medication Instructions:  Your physician recommends that you continue on your current medications as directed. Please refer to the Current Medication list given to you today.   Labwork: Your physician recommends that you return for lab work in: 2 MONTHS TO CHECK YOUR CHOLESTEROL (LIPID, ALT). - On or around June 27, 2018.  - Go to [facility], no appointment needed.  - Please go to the [facility]. You will check in at the front desk to the right as you walk into the atrium. Valet Parking is offered if needed.  - You will need to be FASTING.    Testing/Procedures: none  Follow-Up: Your physician recommends that you schedule a follow-up appointment in: 3 MONTHS WITH DR END.   If you need a refill on your cardiac medications before your next appointment, please call your pharmacy.

## 2018-06-28 ENCOUNTER — Ambulatory Visit: Payer: Self-pay

## 2018-06-28 ENCOUNTER — Other Ambulatory Visit: Payer: Self-pay | Admitting: Family Medicine

## 2018-06-28 DIAGNOSIS — M25561 Pain in right knee: Secondary | ICD-10-CM

## 2018-06-29 ENCOUNTER — Telehealth: Payer: Self-pay | Admitting: Internal Medicine

## 2018-06-29 DIAGNOSIS — E785 Hyperlipidemia, unspecified: Secondary | ICD-10-CM

## 2018-06-29 DIAGNOSIS — Z79899 Other long term (current) drug therapy: Secondary | ICD-10-CM

## 2018-06-29 DIAGNOSIS — R609 Edema, unspecified: Secondary | ICD-10-CM

## 2018-06-29 DIAGNOSIS — I10 Essential (primary) hypertension: Secondary | ICD-10-CM

## 2018-06-29 NOTE — Telephone Encounter (Signed)
Called patient. He said he could not come in this Saturday. He can come in tomorrow morning early around 3 -3:30 am. Called Lab and patient should be able to come in through the ED early in the am and ask for directions to [facility] Once in the [facility], pick up the phone at registration and let them know you are here for outpatient labs. Labs reentered.

## 2018-06-29 NOTE — Telephone Encounter (Signed)
Instead of lipid panel and ALT, let's check a lipid panel, CMP, and BNP when the patient comes for labs this weekend.  Please have him minimize his salt intake and try to elevate his legs.  If he continues to have edema he should let us know so that we can have him be seen in the office and consider adding a diuretic.  If he has shortness of breath or orthopnea, he should let us know right away.  Otherwise, he should continue his current medications.  Yvonne Kendall, MD [facility] Pager: [phone]

## 2018-06-29 NOTE — Telephone Encounter (Signed)
Patient states he went to the [facility] to have cholesterol checked and they told him there was not an order placed Please call to discuss about another cholesterol order Patient also has questions regarding medications

## 2018-06-29 NOTE — Telephone Encounter (Signed)
Called patient. He said he went early this morning around 3 am to the ED for the lab work. They could not see the orders in Epic. Advised patient that they are not the area, unfortunately, where he can get the lab work drawn. Patient cannot go at a later time in the morning because he has to be at work at 6 am. Patient agreeable to go on Saturday morning for the lab work this weekend or next week. Lab orders appear to be in system.   Also, patient has been experiencing swelling and tightness around his knees and ankles. It started about 1 month ago. He feels his mid-section is retaining fluid as well. Denies shortness of breath or chest pain. Advised I would make Dr End aware of the swelling. Patient's next scheduled appointment is 08/03/18.

## 2018-06-30 ENCOUNTER — Other Ambulatory Visit
Admission: AD | Admit: 2018-06-30 | Discharge: 2018-06-30 | Disposition: A | Discharge status: Home / Self Care | Payer: BLUE CROSS/BLUE SHIELD | Source: Ambulatory Visit | Attending: Internal Medicine | Admitting: Internal Medicine

## 2018-06-30 ENCOUNTER — Other Ambulatory Visit: Payer: Self-pay | Admitting: *Deleted

## 2018-06-30 DIAGNOSIS — I10 Essential (primary) hypertension: Secondary | ICD-10-CM | POA: Diagnosis not present

## 2018-06-30 DIAGNOSIS — R609 Edema, unspecified: Secondary | ICD-10-CM | POA: Insufficient documentation

## 2018-06-30 DIAGNOSIS — E785 Hyperlipidemia, unspecified: Secondary | ICD-10-CM | POA: Diagnosis present

## 2018-06-30 DIAGNOSIS — Z79899 Other long term (current) drug therapy: Secondary | ICD-10-CM | POA: Diagnosis present

## 2018-06-30 LAB — COMPREHENSIVE METABOLIC PANEL
ALT: 32 U/L (ref 0–44)
ANION GAP: 6 (ref 5–15)
AST: 30 U/L (ref 15–41)
Albumin: 4 g/dL (ref 3.5–5.0)
Alkaline Phosphatase: 140 U/L — ABNORMAL HIGH (ref 38–126)
BUN: 23 mg/dL — ABNORMAL HIGH (ref 6–20)
CHLORIDE: 110 mmol/L (ref 98–111)
CO2: 26 mmol/L (ref 22–32)
CREATININE: 1.16 mg/dL (ref 0.61–1.24)
Calcium: 9 mg/dL (ref 8.9–10.3)
GFR calc non Af Amer: >60 mL/min (ref >60)
Glucose, Bld: 109 mg/dL — ABNORMAL HIGH (ref 70–99)
POTASSIUM: 3.8 mmol/L (ref 3.5–5.1)
SODIUM: 142 mmol/L (ref 135–145)
Total Bilirubin: 1.1 mg/dL (ref 0.3–1.2)
Total Protein: 7.4 g/dL (ref 6.5–8.1)

## 2018-06-30 LAB — LIPID PANEL
CHOL/HDL RATIO: 2.1 ratio
CHOLESTEROL: 87 mg/dL (ref 0–200)
HDL: 42 mg/dL (ref >40)
LDL CALC: 35 mg/dL (ref 0–99)
Triglycerides: 51 mg/dL (ref <150)
VLDL: 10 mg/dL (ref 0–40)

## 2018-06-30 LAB — BRAIN NATRIURETIC PEPTIDE: B NATRIURETIC PEPTIDE 5: 51 pg/mL (ref 0.0–100.0)

## 2018-06-30 MED ORDER — ATORVASTATIN CALCIUM 40 MG PO TABS: 40.0000 mg | ORAL_TABLET | Freq: Every day | ORAL | 3 refills | Status: DC

## 2018-07-05 ENCOUNTER — Other Ambulatory Visit: Payer: Self-pay

## 2018-07-05 MED ORDER — CARVEDILOL 3.125 MG PO TABS: 3.1250 mg | ORAL_TABLET | Freq: Two times a day (BID) | ORAL | 1 refills | Status: DC

## 2018-07-05 NOTE — Telephone Encounter (Signed)
*  STAT* If patient is at the pharmacy, call can be transferred to refill team.   1. Which medications need to be refilled? (please list name of each medication and dose if known) Carvedilol  2. Which pharmacy/location (including street and city if local pharmacy) is medication to be sent to? [facility] [place]  3. Do they need a 30 day or 90 day supply? 90

## 2018-07-07 ENCOUNTER — Encounter (HOSPITAL_COMMUNITY): Payer: Self-pay | Admitting: Internal Medicine

## 2018-07-08 ENCOUNTER — Telehealth: Payer: Self-pay | Admitting: Internal Medicine

## 2018-07-08 NOTE — Telephone Encounter (Signed)
Shawn RN from case management called to verify the patient's cardiac medications so that all of the patient's provider's will be on the same page for the patient. She has verbalized her understanding of the correct medications and dosages.

## 2018-07-08 NOTE — Telephone Encounter (Signed)
Case management rn is helping patient with medication reconciliation  To help patient    Please call

## 2018-08-03 ENCOUNTER — Ambulatory Visit: Payer: BLUE CROSS/BLUE SHIELD | Admitting: Internal Medicine

## 2018-08-03 ENCOUNTER — Encounter: Payer: Self-pay | Admitting: Internal Medicine

## 2018-08-03 VITALS — BP 124/80 | Ht 71.0 in | Wt 315.0 lb

## 2018-08-03 DIAGNOSIS — I5031 Acute diastolic (congestive) heart failure: Secondary | ICD-10-CM | POA: Diagnosis not present

## 2018-08-03 DIAGNOSIS — I2510 Atherosclerotic heart disease of native coronary artery without angina pectoris: Secondary | ICD-10-CM | POA: Diagnosis not present

## 2018-08-03 DIAGNOSIS — Z720 Tobacco use: Secondary | ICD-10-CM

## 2018-08-03 DIAGNOSIS — E785 Hyperlipidemia, unspecified: Secondary | ICD-10-CM

## 2018-08-03 MED ORDER — FUROSEMIDE 20 MG PO TABS: ORAL_TABLET | ORAL | 6 refills | Status: DC

## 2018-08-03 NOTE — Patient Instructions (Signed)
Medication Instructions:  Your physician has recommended you make the following change in your medication:  1- START Furosemide 20 mg (1 tablet) by mouth once a day for 1 week, then take 1 tablet by mouth once a day AS NEEDED for swelling or weight gain.   Labwork: Your physician recommends that you return for lab work in: TODAY (BMET).   Testing/Procedures: none  Follow-Up: Your physician recommends that you schedule a follow-up appointment in: 1 MONTH WITH DR END OR APP.  If you need a refill on your cardiac medications before your next appointment, please call your pharmacy.

## 2018-08-03 NOTE — Progress Notes (Signed)
Follow-up Outpatient Visit Date: 08/03/2018  Primary Care Provider: Nonnie Done., MD [address] [facility] [place] [address]  Chief Complaint: Follow-up coronary artery disease  HPI:  [name] Schmidt is a 52 y.o. year-old male with history of coronary artery disease with lateral STEMI on 4/15/201, who presents for follow-up of coronary artery disease.  I last saw him in May, about 1 month after his lateral STEMI.  At that time, he was feeling well without any further episodes of chest pain.  Today, [name] Schmidt reports that he often feels somewhat drained and a little short of breath at the [name] of the day.  This seems to be a daily occurrence when he is working 11 or 12 hours.  He is typically able to go to the gym and is not symptomatic.  He notes one episode of left lateral chest wall pain that was nonexertional and resolved spontaneously.  It has not recurred.  He denies lower extremity edema at this time, but noticed some a few weeks ago.  He has also noted that his weight has increased over the last few months.  He denies orthopnea and PND.  He remains compliant with his medications.  --------------------------------------------------------------------------------------------------  Past Medical History:  Diagnosis Date  . Coronary artery disease   . GERD (gastroesophageal reflux disease)   . Hypertension   . STEMI (ST elevation myocardial infarction) (HCC)    4/19 PCI/DES x1 to D1  . Tobacco abuse    Past Surgical History:  Procedure Laterality Date  . CORONARY/GRAFT ACUTE MI REVASCULARIZATION N/A 04/04/2018   Procedure: Coronary/Graft Acute MI Revascularization;  Surgeon: Yvonne Kendall, MD;  Location: [facility] INVASIVE CV LAB;  Service: Cardiovascular;  Laterality: N/A;  . LEFT HEART CATH AND CORONARY ANGIOGRAPHY N/A 04/04/2018   Procedure: LEFT HEART CATH AND CORONARY ANGIOGRAPHY;  Surgeon: Yvonne Kendall, MD;  Location: [facility] INVASIVE CV LAB;  Service: Cardiovascular;  Laterality: N/A;      Current Meds  Medication Sig  . aspirin 81 MG chewable tablet Chew 1 tablet (81 mg total) by mouth daily.  [name] Schmidt atorvastatin (LIPITOR) 40 MG tablet Take 1 tablet (40 mg total) by mouth daily.  . carvedilol (COREG) 3.125 MG tablet Take 1 tablet (3.125 mg total) by mouth 2 (two) times daily with a meal.  . nitroGLYCERIN (NITROSTAT) 0.4 MG SL tablet Place 1 tablet (0.4 mg total) under the tongue every 5 (five) minutes as needed.  [name] Schmidt omeprazole (PRILOSEC) 40 MG capsule Take 40 mg by mouth daily.  . tamsulosin (FLOMAX) 0.4 MG CAPS capsule Take 0.4 mg by mouth daily.  . ticagrelor (BRILINTA) 90 MG TABS tablet Take 1 tablet (90 mg total) by mouth 2 (two) times daily.    Allergies: Patient has no known allergies.  Social History   Tobacco Use  . Smoking status: Current Every Day Smoker    Packs/day: 0.50    Types: Cigarettes    Last attempt to quit: 04/05/2018    Years since quitting: 0.3  . Smokeless tobacco: Never Used  Substance Use Topics  . Alcohol use: Not Currently  . Drug use: Never    Family History  Problem Relation Age of Onset  . Cancer Mother     Review of Systems: A 12-system review of systems was performed and was negative except as noted in the HPI.  --------------------------------------------------------------------------------------------------  Physical Exam: BP 124/80 (BP Location: Left Arm, Patient Position: Sitting, Cuff Size: Large)   Ht 5\' 11"  (1.803 m)   Wt [name] Schmidt)  315 lb (142.9 kg)   BMI 43.93 kg/m   General: Morbidly obese man, seated comfortably in the exam room. HEENT: No conjunctival pallor or scleral icterus. Moist mucous membranes.  OP clear. Neck: Supple without lymphadenopathy, thyromegaly, JVD, or HJR. Lungs: Normal work of breathing. Clear to auscultation bilaterally without wheezes or crackles. Heart: Regular rate and rhythm without murmurs, rubs, or gallops.  Unable to assess PMI due to body habitus. Abd: Bowel sounds present. Soft, NT/ND.   Unable to assess HSM due to body habitus. Ext: Trace pretibial edema bilaterally. Skin: Warm and dry without rash.  EKG: Normal sinus rhythm, low voltage, septal Q waves, and lateral T wave inversions.  No significant change from prior tracing.  Lab Results  Component Value Date   WBC 9.7 04/06/2018   HGB 16.7 04/06/2018   HCT 52.5 (H) 04/06/2018   MCV 82.9 04/06/2018   PLT 163 04/06/2018    Lab Results  Component Value Date   NA 145 (H) 08/03/2018   K 4.2 08/03/2018   CL 109 (H) 08/03/2018   CO2 21 08/03/2018   BUN 21 08/03/2018   CREATININE 1.51 (H) 08/03/2018   GLUCOSE 87 08/03/2018   ALT 32 06/30/2018    Lab Results  Component Value Date   CHOL 87 06/30/2018   HDL 42 06/30/2018   LDLCALC 35 06/30/2018   TRIG 51 06/30/2018   CHOLHDL 2.1 06/30/2018    --------------------------------------------------------------------------------------------------  ASSESSMENT AND PLAN: Coronary artery disease without angina No chest pain other than a single episode of left lateral chest wall discomfort a week or 2 ago.  I doubt that this represents significant coronary insufficiency.  We will continue current medications for secondary prevention, including at least 12 months of DAPT.  Continue carvedilol 3.125 mg twice daily as well.  HFpEF I am concerned that some of [name] shortness of breath towards the Armonii Sieh of the day could represent an element of heart failure.  His weight has notably increased by 26 pounds since May.  He also has trace edema on exam today.  We have agreed to initiate furosemide 20 mg daily for a week, followed by daily as needed for weight gain or swelling.  I will check a basic metabolic panel today to assess his renal function and potassium, given mildly elevated creatinine at the time of his hospitalization in April.  Hyperlipidemia LDL under good control.  Continue high intensity statin therapy.  CKD stage III Mild edema with accompanying weight gain,  as noted above.  In the setting of fluid retention and addition of furosemide, I will check a BMP today.  Tobacco abuse [name] continues to smoke.  I encouraged him to stop.  Morbid obesity Unfortunately, Mr. Kipp has put on further weight since May.  Some of this may be related to fluid retention, though I suspect dietary indiscretion is contributing.  I stressed the importance of weight loss through diet and exercise.  He did not participate in cardiac rehab and believes that his job would prevent him from going on a regular basis.  Follow-up: Return to clinic in 1 month.  Yvonne Kendall, MD 08/04/2018 8:45 PM

## 2018-08-04 ENCOUNTER — Other Ambulatory Visit: Payer: Self-pay | Admitting: *Deleted

## 2018-08-04 ENCOUNTER — Telehealth: Payer: Self-pay | Admitting: Internal Medicine

## 2018-08-04 ENCOUNTER — Encounter: Payer: Self-pay | Admitting: Internal Medicine

## 2018-08-04 DIAGNOSIS — I10 Essential (primary) hypertension: Secondary | ICD-10-CM

## 2018-08-04 DIAGNOSIS — I2109 ST elevation (STEMI) myocardial infarction involving other coronary artery of anterior wall: Secondary | ICD-10-CM

## 2018-08-04 DIAGNOSIS — Z79899 Other long term (current) drug therapy: Secondary | ICD-10-CM

## 2018-08-04 LAB — BASIC METABOLIC PANEL
BUN/Creatinine Ratio: 14 (ref 9–20)
BUN: 21 mg/dL (ref 6–24)
CO2: 21 mmol/L (ref 20–29)
CREATININE: 1.51 mg/dL — AB (ref 0.76–1.27)
Calcium: 9.4 mg/dL (ref 8.7–10.2)
Chloride: 109 mmol/L — ABNORMAL HIGH (ref 96–106)
GFR calc non Af Amer: 53 mL/min/{1.73_m2} — ABNORMAL LOW (ref >59)
GFR, EST AFRICAN AMERICAN: 61 mL/min/{1.73_m2} (ref >59)
Glucose: 87 mg/dL (ref 65–99)
Potassium: 4.2 mmol/L (ref 3.5–5.2)
Sodium: 145 mmol/L — ABNORMAL HIGH (ref 134–144)

## 2018-08-04 NOTE — Telephone Encounter (Signed)
Patient returning call for lab results. 

## 2018-08-04 NOTE — Telephone Encounter (Signed)
No answer. Left message to call back.   

## 2018-08-05 NOTE — Telephone Encounter (Signed)
Spoke to patient yesterday, 08/04/18 at 12:40 pm. See result note.

## 2018-08-12 ENCOUNTER — Other Ambulatory Visit
Admission: RE | Admit: 2018-08-12 | Discharge: 2018-08-12 | Disposition: A | Discharge status: Home / Self Care | Payer: BLUE CROSS/BLUE SHIELD | Source: Ambulatory Visit | Attending: Internal Medicine | Admitting: Internal Medicine

## 2018-08-12 DIAGNOSIS — I10 Essential (primary) hypertension: Secondary | ICD-10-CM | POA: Diagnosis not present

## 2018-08-12 DIAGNOSIS — Z79899 Other long term (current) drug therapy: Secondary | ICD-10-CM | POA: Insufficient documentation

## 2018-08-12 DIAGNOSIS — I2109 ST elevation (STEMI) myocardial infarction involving other coronary artery of anterior wall: Secondary | ICD-10-CM | POA: Insufficient documentation

## 2018-08-12 LAB — BASIC METABOLIC PANEL
ANION GAP: 5 (ref 5–15)
BUN: 24 mg/dL — ABNORMAL HIGH (ref 6–20)
CO2: 26 mmol/L (ref 22–32)
Calcium: 8.9 mg/dL (ref 8.9–10.3)
Chloride: 106 mmol/L (ref 98–111)
Creatinine, Ser: 1.27 mg/dL — ABNORMAL HIGH (ref 0.61–1.24)
GFR calc Af Amer: >60 mL/min (ref >60)
GLUCOSE: 72 mg/dL (ref 70–99)
POTASSIUM: 4 mmol/L (ref 3.5–5.1)
Sodium: 137 mmol/L (ref 135–145)

## 2018-09-04 IMAGING — DX DG KNEE COMPLETE 4+V*R*
4 series · 4 of 4 positions shown · non-contrast
Comparison: None.

CLINICAL DATA: Knee pain medially.

EXAM:
RIGHT KNEE - COMPLETE 4+ VIEW

[knee pa]
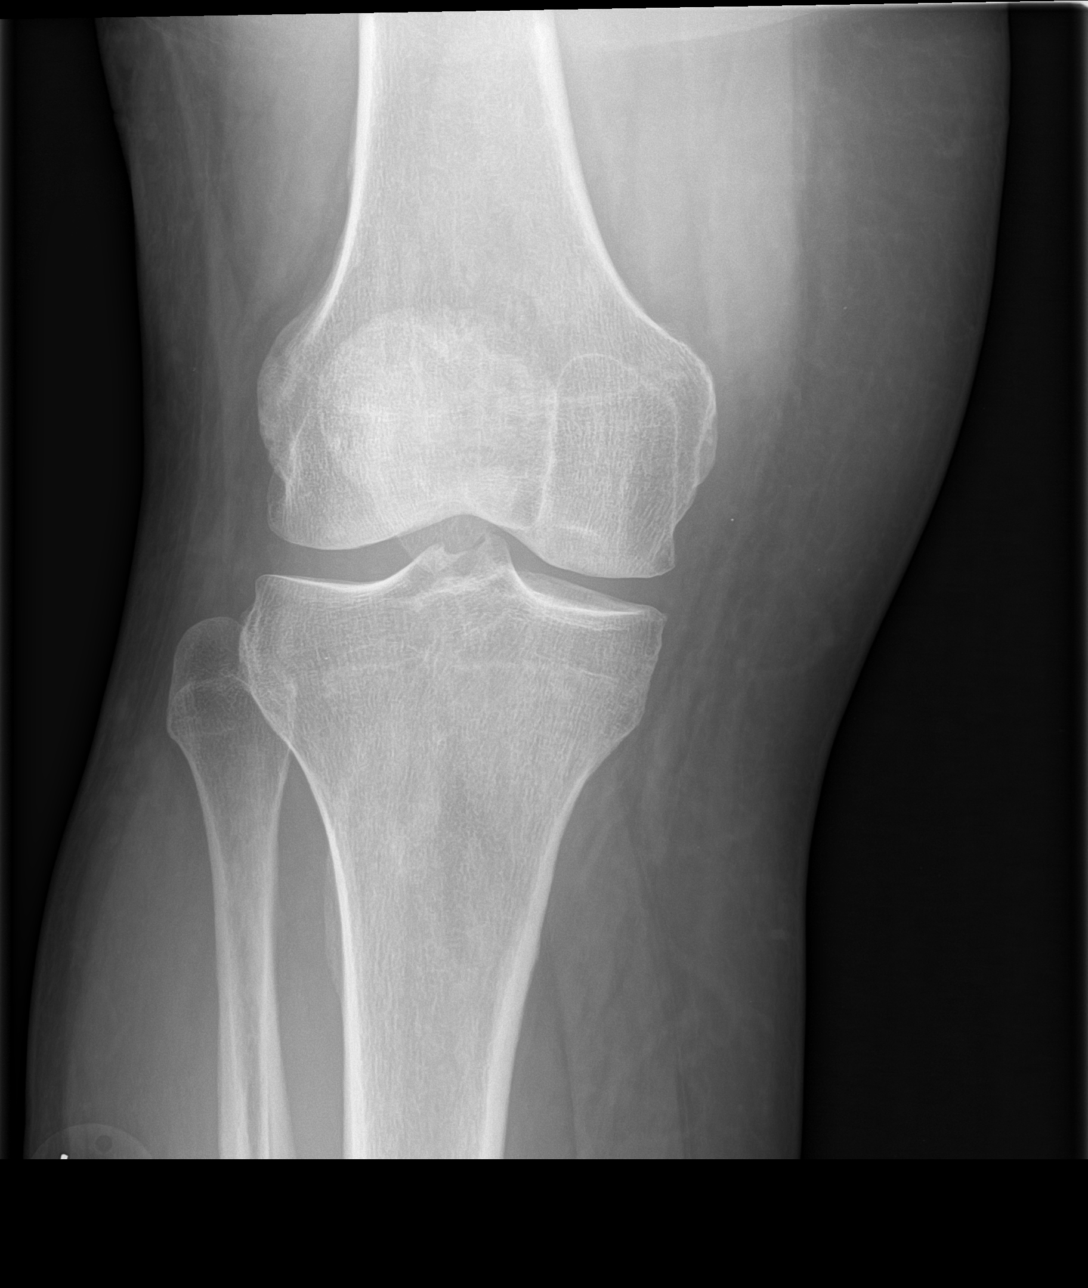

[knee obl (1 of 2)]
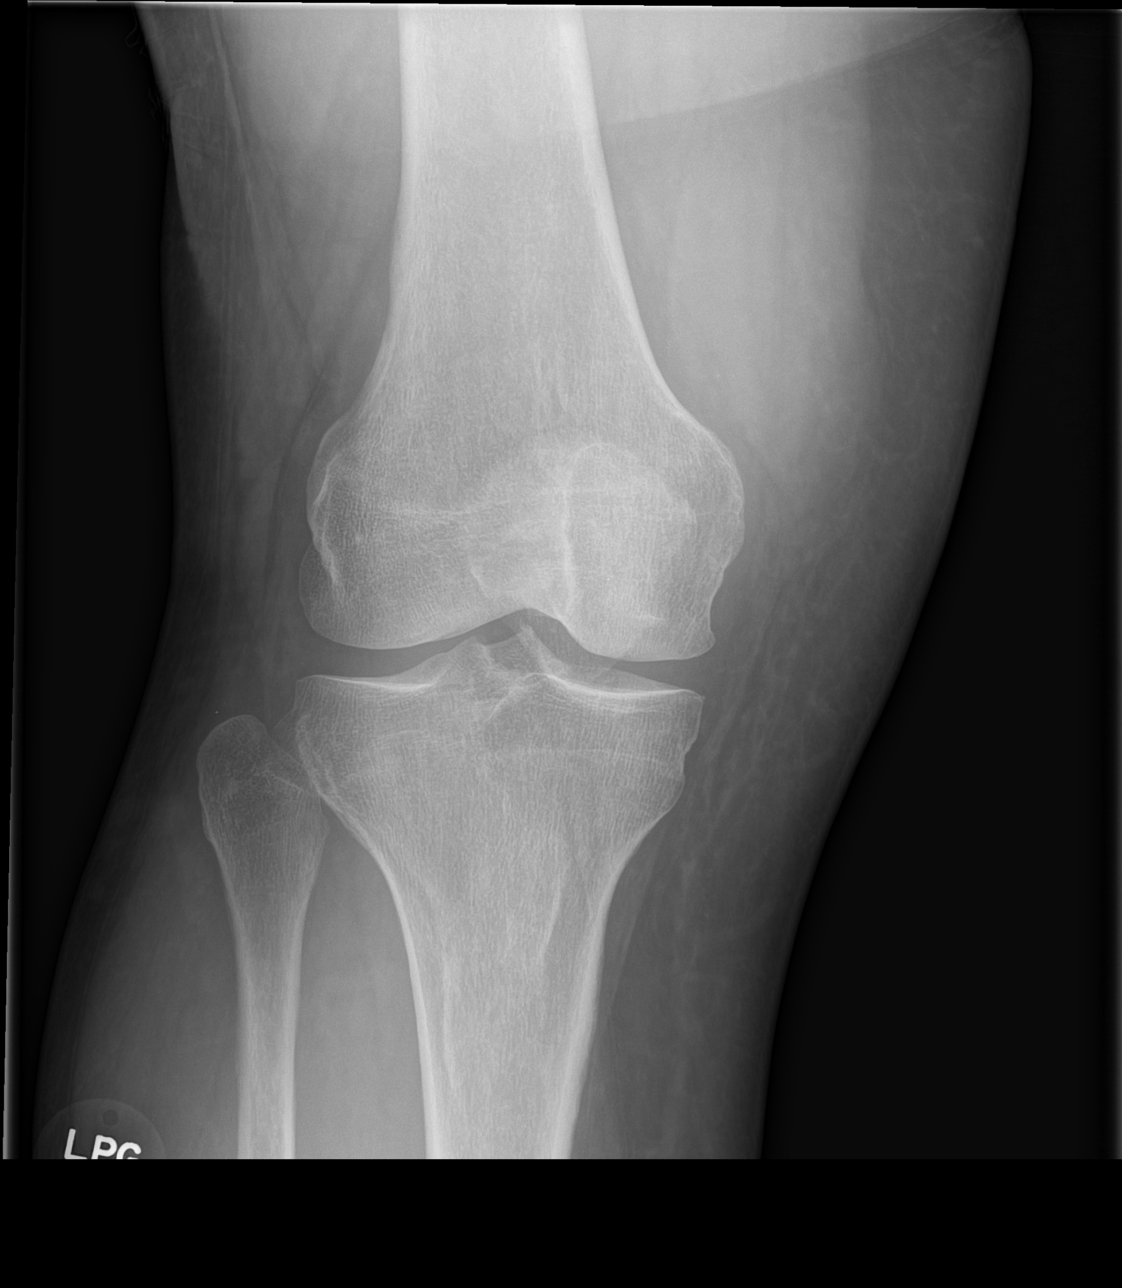

[knee obl (2 of 2)]
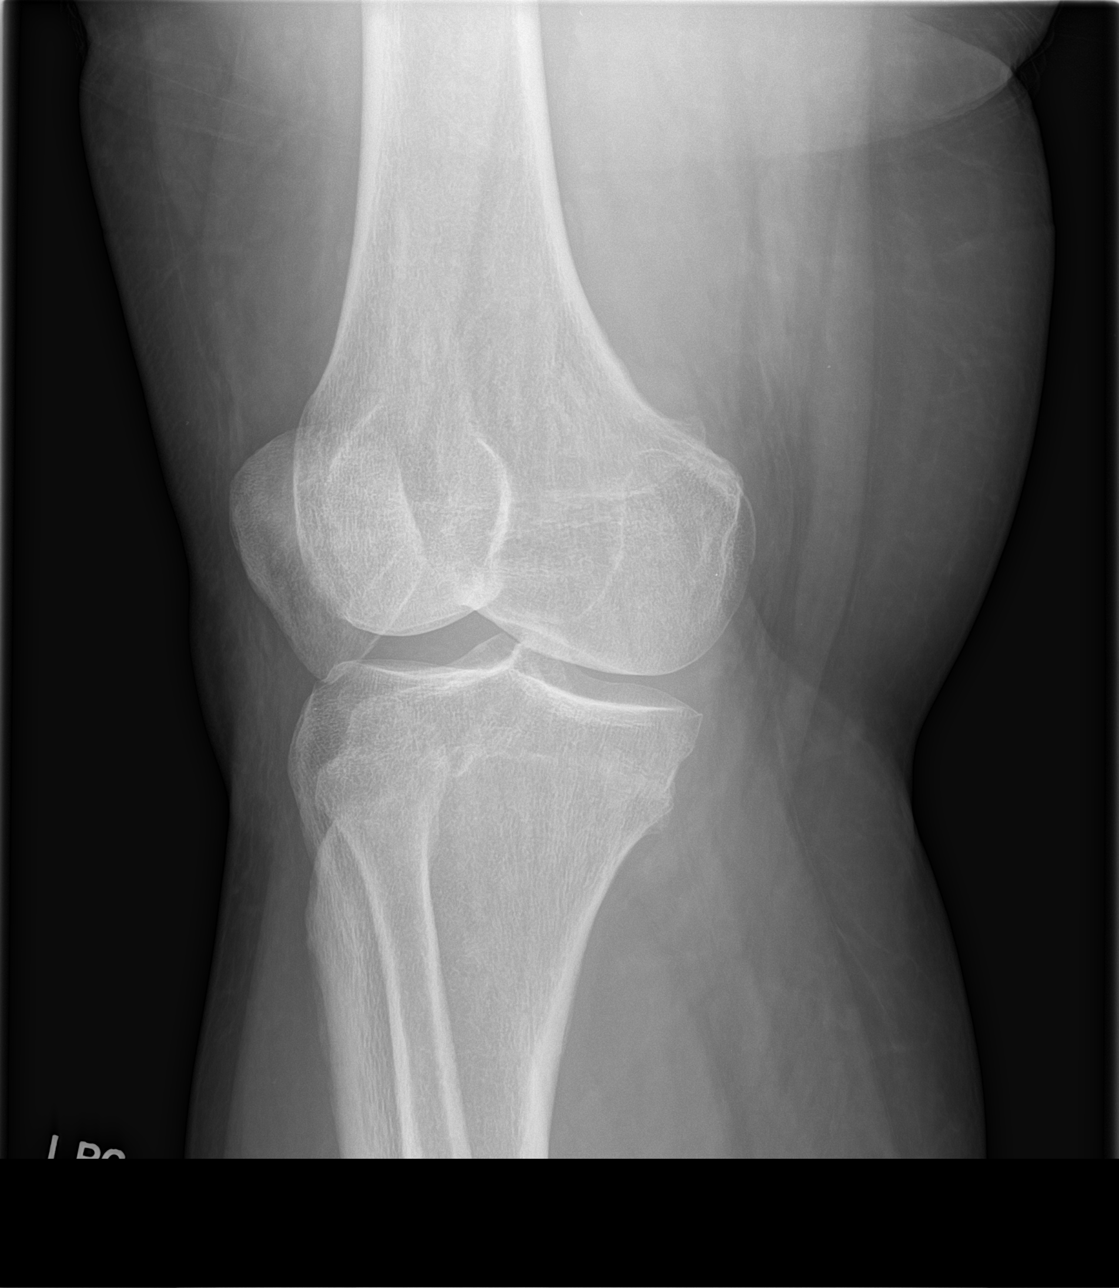

[knee lat]
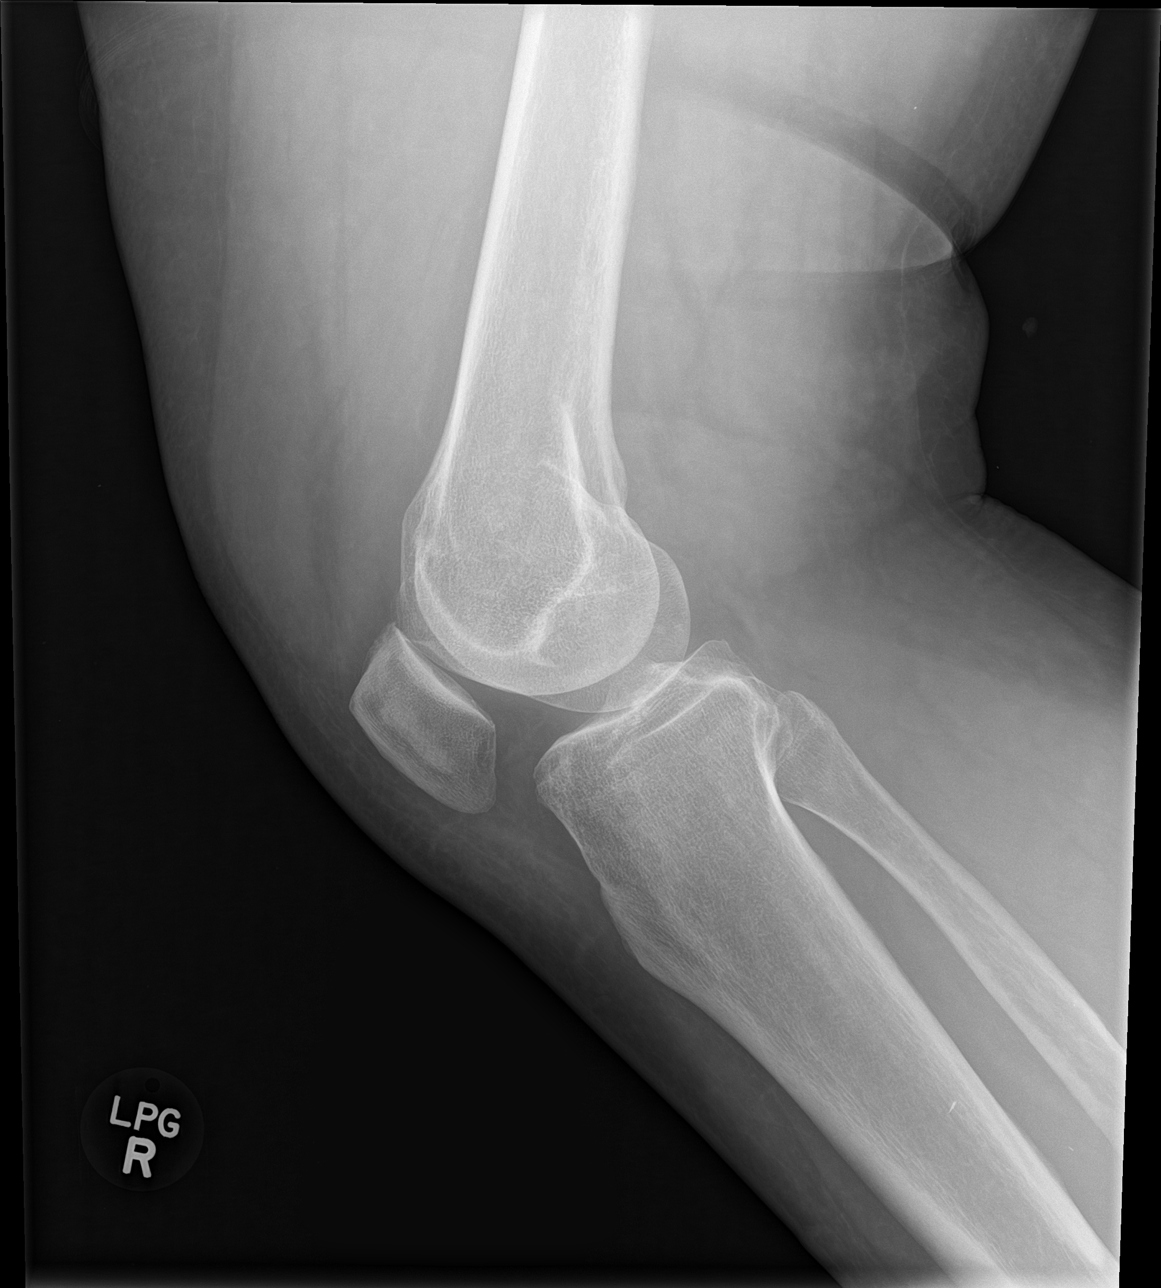

[4 of 4 positions shown; findings below may reference images not displayed]

FINDINGS: No evidence of fracture, dislocation, or joint effusion. No evidence
of arthropathy or other focal bone abnormality. Soft tissues are
unremarkable.
IMPRESSION: No acute osseous injury of the right knee.

## 2018-09-09 ENCOUNTER — Ambulatory Visit: Payer: Self-pay | Admitting: Nurse Practitioner

## 2018-09-12 ENCOUNTER — Ambulatory Visit: Payer: BLUE CROSS/BLUE SHIELD | Admitting: Nurse Practitioner

## 2018-09-12 ENCOUNTER — Encounter: Payer: Self-pay | Admitting: Nurse Practitioner

## 2018-09-12 VITALS — BP 110/80 | HR 85 | Ht 71.0 in | Wt 306.8 lb

## 2018-09-12 DIAGNOSIS — I10 Essential (primary) hypertension: Secondary | ICD-10-CM | POA: Diagnosis not present

## 2018-09-12 DIAGNOSIS — N183 Chronic kidney disease, stage 3 unspecified: Secondary | ICD-10-CM

## 2018-09-12 DIAGNOSIS — R0683 Snoring: Secondary | ICD-10-CM

## 2018-09-12 DIAGNOSIS — I2510 Atherosclerotic heart disease of native coronary artery without angina pectoris: Secondary | ICD-10-CM

## 2018-09-12 DIAGNOSIS — I5032 Chronic diastolic (congestive) heart failure: Secondary | ICD-10-CM

## 2018-09-12 DIAGNOSIS — Z720 Tobacco use: Secondary | ICD-10-CM

## 2018-09-12 DIAGNOSIS — E785 Hyperlipidemia, unspecified: Secondary | ICD-10-CM

## 2018-09-12 NOTE — Patient Instructions (Signed)
Medication Instructions:  Your physician recommends that you continue on your current medications as directed. Please refer to the Current Medication list given to you today.  Labwork: none  Testing/Procedures: Your physician has requested that you have an LIMITED echocardiogram. Echocardiography is a painless test that uses sound waves to create images of your heart. It provides your doctor with information about the size and shape of your heart and how well your heart's chambers and valves are working. This procedure takes approximately one hour. There are no restrictions for this procedure. You may get an IV, if needed, to receive an ultrasound enhancing agent through to better visualize your heart.     Follow-Up: Your physician recommends that you schedule a follow-up appointment in: 3 MONTHS WITH DR END.   If you need a refill on your cardiac medications before your next appointment, please call your pharmacy.

## 2018-09-12 NOTE — Progress Notes (Signed)
Office Visit    Patient Name: [name] Schmidt Date of Encounter: 09/12/2018  Primary Care Provider:  Nonnie Done., MD Primary Cardiologist:  Yvonne Kendall, MD  Chief Complaint    52 year old male with a history of CAD status post lateral STEMI and PCI and drug-eluting stent placement to the second diagonal in April 2019, HFpEF, stage III chronic kidney disease, hypertension, hyperlipidemia, obesity, and tobacco abuse, who presents for follow-up related to volume overload and weight gain.  Past Medical History    Past Medical History:  Diagnosis Date  . (HFpEF) heart failure with preserved ejection fraction (HCC)   . CKD (chronic kidney disease), stage III (HCC)   . Coronary artery disease    a. 03/2018 Lateral STEMI/PCI: LMnl, LAD 20p, 38m, D2 100ost (2.25x12 Resolute Onyx DES), LCX large/nl, OM1/2 large/nl, RCA large, min irregs, RPDA/RPAV large/nl.  . GERD (gastroesophageal reflux disease)   . Hyperlipidemia LDL goal <70   . Hypertension   . Morbid obesity (HCC)   . STEMI (ST elevation myocardial infarction) (HCC)    4/19 PCI/DES x1 to D2.  . Tobacco abuse    Past Surgical History:  Procedure Laterality Date  . CORONARY/GRAFT ACUTE MI REVASCULARIZATION N/A 04/04/2018   Procedure: Coronary/Graft Acute MI Revascularization;  Surgeon: Yvonne Kendall, MD;  [facility]: [facility];  Service: Cardiovascular;  Laterality: N/A;  . LEFT HEART CATH AND CORONARY ANGIOGRAPHY N/A 04/04/2018   Procedure: LEFT HEART CATH AND CORONARY ANGIOGRAPHY;  Surgeon: Yvonne Kendall, MD;  Location: [facility];  Service: Cardiovascular;  Laterality: N/A;    Allergies  No Known Allergies  History of Present Illness    52 year old male with the above past medical history including coronary artery disease status post lateral STEMI in April 2019 with finding of a thrombotic occlusion of the second diagonal and otherwise nonobstructive disease.  The second diagonal was successfully  treated with a drug-eluting stent.  Other history includes HFpEF, hypertension, hyperlipidemia, morbid obesity, tobacco abuse, GERD, and stage III chronic kidney disease.  He was last seen in clinic in mid August, at which time his weight was up 26 pounds over a 77-month period.  He was placed on oral Lasix and subsequent follow-up lab work showed stable renal function.    Since his last visit, his wt is down 9 lbs.  He says this all pretty much came off in the first week, when he was using lasix daily.  He is now only using about twice/week, and usually only after he had been on his feet all day at work and as a result, developed ankle swelling.  He notes good response from low dose lasix and denies chest pain, palpitations, dyspnea, pnd, orthopnea, n, v, dizziness, syncope, edema, or early satiety.  He has gone back to smoking about 2-3 cigarettes/day.  He says that he thinks it's helped him to lose some of the weight.  That said, he bought nicotine patches yesterday and plans to start using them tomorrow.  Home Medications    Prior to Admission medications   Medication Sig Start Date End Date Taking? Authorizing Provider  aspirin 81 MG chewable tablet Chew 1 tablet (81 mg total) by mouth daily. 04/07/18   Arty Baumgartner, NP  atorvastatin (LIPITOR) 40 MG tablet Take 1 tablet (40 mg total) by mouth daily. 06/30/18 09/28/18  End, Cristal Deer, MD  carvedilol (COREG) 3.125 MG tablet Take 1 tablet (3.125 mg total) by mouth 2 (two) times daily with a meal.  07/05/18   End, Cristal Deer, MD  furosemide (LASIX) 20 MG tablet Take 1 tablet (20 mg) by mouth once a day for 1 week, then take 1 tablet by mouth daily AS NEEDED for swelling or weight gain. 08/03/18   End, Cristal Deer, MD  nitroGLYCERIN (NITROSTAT) 0.4 MG SL tablet Place 1 tablet (0.4 mg total) under the tongue every 5 (five) minutes as needed. 04/06/18   Arty Baumgartner, NP  omeprazole (PRILOSEC) 40 MG capsule Take 40 mg by mouth daily. 01/03/18    [provider]  tamsulosin (FLOMAX) 0.4 MG CAPS capsule Take 0.4 mg by mouth daily. 01/02/18   [provider]  ticagrelor (BRILINTA) 90 MG TABS tablet Take 1 tablet (90 mg total) by mouth 2 (two) times daily. 04/06/18   Arty Baumgartner, NP    Review of Systems    He denies chest pain, palpitations, dyspnea, pnd, orthopnea, n, v, dizziness, syncope, edema, weight gain, or early satiety.  All other systems reviewed and are otherwise negative except as noted above.  Physical Exam    VS:  BP 110/80 (BP Location: Left Arm, Patient Position: Sitting, Cuff Size: Large)   Pulse 85   Ht 5\' 11"  (1.803 m)   Wt (!) 306 lb 12 oz (139.1 kg)   BMI 42.78 kg/m  , BMI Body mass index is 42.78 kg/m. GEN: Obese, in no acute distress. HEENT: normal. Neck: Supple, no JVD, carotid bruits, or masses. Cardiac: RRR, no murmurs, rubs, or gallops. No clubbing, cyanosis, edema.  Radials/DP/PT 2+ and equal bilaterally.  Respiratory:  Respirations regular and unlabored, clear to auscultation bilaterally. GI: Soft, nontender, nondistended, BS + x 4. MS: no deformity or atrophy. Skin: warm and dry, no rash. Neuro:  Strength and sensation are intact. Psych: Normal affect.  Accessory Clinical Findings    ECG personally reviewed by me today - RSR, 85, LAD, lateral infarct w/ TWI - no acute changes.  Lab Results  Component Value Date   CHOL 87 06/30/2018   HDL 42 06/30/2018   LDLCALC 35 06/30/2018   TRIG 51 06/30/2018   CHOLHDL 2.1 06/30/2018    Lab Results  Component Value Date   ALT 32 06/30/2018   AST 30 06/30/2018   ALKPHOS 140 (H) 06/30/2018   BILITOT 1.1 06/30/2018    Lab Results  Component Value Date   CREATININE 1.27 (H) 08/12/2018   BUN 24 (H) 08/12/2018   NA 137 08/12/2018   K 4.0 08/12/2018   CL 106 08/12/2018   CO2 26 08/12/2018    Assessment & Plan    1.  CAD: s/p lateral STEMI and PCI/DES to the D2 in April 2019.  He has not been having any chest pain or  dyspnea.  He is not routinely exercising but has remained active w/o limitations.  He remains on asa, brilinta, statin, and  blocker therapy.  2.  HFpEF:  Significant wt gain noted in August and he was placed on low dose lasix x 1 wk.  He is now using lasix ~ twice weekly as needed for lower ext swelling.  He is euvolemic on exam today.  HR/BP stable.  BMET stable in August.  We discussed the importance of daily weights, sodium restriction, medication compliance, and symptom reporting and he verbalizes understanding.  I note that LV fxn was not able to be determined by echo in April and a rec was made for outpt limited echo w/ definity.  Pt says that he forgot about that and it's  not yet taken place.  I will reorder.  3.  Essential HTN:  Stable.  4.  HL:  LDL 35 in July w/ stable LFT's.  Cont lipitor 40mg .  5.  Tob Abuse:  He has resumed smoking about 3 cigarettes/day.  He recognizes that he needs to quit and purchased nicotine patches yesterday with a plan to quit tomorrow.  I encouraged him to do his best to remain off of cigarettes.  6.  Morbid Obesity:  He has lost some weight.  He is not routinely exercising and did not previously have a way of fitting cardiac rehab into his life schedule.  I encouraged him to try and get 30 mins of exercise daily.  7.  Snores:  He's never had a sleep study and isn't sure that he wants one.  I advised that we can look at his echo to see if PA pressures are elevated.  If so, I would more strongly encourage him to pursue a sleep study. He was agreeable with that plan.  8.  CKD III:  Stable by last evaluation after using lasix daily for a week.  Now only using ~ twice/wk.  9.  Disposition:  F/u limited echo.  F/u in 3 months or sooner if necessary.  Nicolasa Ducking, NP 09/12/2018, 3:07 PM

## 2018-09-22 ENCOUNTER — Other Ambulatory Visit: Payer: Self-pay

## 2018-09-22 ENCOUNTER — Ambulatory Visit (INDEPENDENT_AMBULATORY_CARE_PROVIDER_SITE_OTHER): Payer: BLUE CROSS/BLUE SHIELD

## 2018-09-22 DIAGNOSIS — I5032 Chronic diastolic (congestive) heart failure: Secondary | ICD-10-CM | POA: Diagnosis not present

## 2018-11-25 ENCOUNTER — Telehealth: Payer: Self-pay | Admitting: Internal Medicine

## 2018-11-25 NOTE — Telephone Encounter (Signed)
1. What dental office are you calling from? Patient calling [facility] [place]   2. What is your office phone number?  [phone]  3. What is your fax number? Unknown   4. What type of procedure is the patient having performed? Dental Extractions and a bridge and periodontal cleaning   5. What date is procedure scheduled or is the patient there now?  asap tbd for clearance   6. What is your question (ex. Antibiotics prior to procedure, holding medication-we need to know how long dentist wants pt to hold med)?  Per patient they will fax a request

## 2018-11-27 NOTE — Telephone Encounter (Signed)
I think it is reasonable for Mr. Bedner to proceed with dental procedures, which are low risk from a cardiac standpoint.  He should remain on aspirin and ticagrelor in the perioperative period.  Antibiotic prophylaxis is not indicated.  Yvonne Kendall, MD [facility] Pager: [phone]

## 2018-11-29 NOTE — Telephone Encounter (Signed)
Clearance routed to dental office via Epic fax.

## 2018-12-07 ENCOUNTER — Ambulatory Visit: Payer: Self-pay | Admitting: Internal Medicine

## 2018-12-09 ENCOUNTER — Encounter: Payer: Self-pay | Admitting: Internal Medicine

## 2018-12-09 ENCOUNTER — Ambulatory Visit: Payer: BLUE CROSS/BLUE SHIELD | Admitting: Internal Medicine

## 2018-12-09 VITALS — BP 108/80 | HR 80 | Ht 71.0 in | Wt 312.2 lb

## 2018-12-09 DIAGNOSIS — E785 Hyperlipidemia, unspecified: Secondary | ICD-10-CM

## 2018-12-09 DIAGNOSIS — Z01810 Encounter for preprocedural cardiovascular examination: Secondary | ICD-10-CM

## 2018-12-09 DIAGNOSIS — I10 Essential (primary) hypertension: Secondary | ICD-10-CM | POA: Diagnosis not present

## 2018-12-09 DIAGNOSIS — I2510 Atherosclerotic heart disease of native coronary artery without angina pectoris: Secondary | ICD-10-CM

## 2018-12-09 DIAGNOSIS — I5032 Chronic diastolic (congestive) heart failure: Secondary | ICD-10-CM | POA: Diagnosis not present

## 2018-12-09 NOTE — Progress Notes (Signed)
Follow-up Outpatient Visit Date: 12/09/2018  Primary Care Provider: Nonnie Done., MD [address] [facility] [address]  Chief Complaint: Follow-up coronary artery disease  HPI:  [name] Schmidt is a 52 y.o. year-old male with history of coronary artery disease with lateral STEMI on 04/04/18, hypertension, hyperlipidemia, GERD, tobacco use, and morbid obesity, who presents for follow-up of coronary artery disease.  He was last seen in our office in late September by Ward Givens, NP, at which time he was doing well.  Subsequent limited echocardiogram showed low normal left ventricular systolic function (EF 50-55%) and mild mitral regurgitation.  Today,, [name] Schmidt feels well.  He has not had any chest pain or shortness of breath.  He notes occasional swelling in his hands from time to time, for which he will use furosemide every few weeks.  He has not had any leg edema nor orthopnea.  He exercises regularly, going to the gym 3 days a week.  He does mostly strength training and only a limited amount of cardiovascular exercise.  He has restarted smoking but is trying to quit.  He recently broke a tooth that had been crowned in the past.  He is in need of further dental work in the near future.  He remains on aspirin and ticagrelor, noting bleeding only when he recently broke his tooth.  --------------------------------------------------------------------------------------------------  Past Medical History:  Diagnosis Date  . (HFpEF) heart failure with preserved ejection fraction (HCC)   . CKD (chronic kidney disease), stage III (HCC)   . Coronary artery disease    a. 03/2018 Lateral STEMI/PCI: LMnl, LAD 20p, 63m, D2 100ost (2.25x12 Resolute Onyx DES), LCX large/nl, OM1/2 large/nl, RCA large, min irregs, RPDA/RPAV large/nl.  . GERD (gastroesophageal reflux disease)   . Hyperlipidemia LDL goal <70   . Hypertension   . Morbid obesity (HCC)   . STEMI (ST elevation myocardial infarction) (HCC)      4/19 PCI/DES x1 to D2.  . Tobacco abuse    Past Surgical History:  Procedure Laterality Date  . CORONARY/GRAFT ACUTE MI REVASCULARIZATION N/A 04/04/2018   Procedure: Coronary/Graft Acute MI Revascularization;  Surgeon: Yvonne Kendall, MD;  Location: [facility];  Service: Cardiovascular;  Laterality: N/A;  . LEFT HEART CATH AND CORONARY ANGIOGRAPHY N/A 04/04/2018   Procedure: LEFT HEART CATH AND CORONARY ANGIOGRAPHY;  Surgeon: Yvonne Kendall, MD;  Location: [facility];  Service: Cardiovascular;  Laterality: N/A;    Recent CV Pertinent Labs: Lab Results  Component Value Date   CHOL 87 06/30/2018   HDL 42 06/30/2018   LDLCALC 35 06/30/2018   TRIG 51 06/30/2018   CHOLHDL 2.1 06/30/2018   INR 1.01 04/04/2018   BNP 51.0 06/30/2018   K 4.0 08/12/2018   BUN 24 (H) 08/12/2018   BUN 21 08/03/2018   CREATININE 1.27 (H) 08/12/2018    Past medical and surgical history were reviewed and updated in EPIC.  Current Meds  Medication Sig  . aspirin 81 MG chewable tablet Chew 1 tablet (81 mg total) by mouth daily.  [name] atorvastatin (LIPITOR) 40 MG tablet Take 1 tablet (40 mg total) by mouth daily.  . carvedilol (COREG) 3.125 MG tablet Take 1 tablet (3.125 mg total) by mouth 2 (two) times daily with a meal.  . furosemide (LASIX) 20 MG tablet Take 1 tablet (20 mg) by mouth once a day for 1 week, then take 1 tablet by mouth daily AS NEEDED for swelling or weight gain. (Patient taking differently: 1  tablet by mouth daily AS NEEDED for swelling or weight gain.)  . nitroGLYCERIN (NITROSTAT) 0.4 MG SL tablet Place 1 tablet (0.4 mg total) under the tongue every 5 (five) minutes as needed.  [name] omeprazole (PRILOSEC) 40 MG capsule Take 40 mg by mouth daily.  . tamsulosin (FLOMAX) 0.4 MG CAPS capsule Take 0.4 mg by mouth daily.  . ticagrelor (BRILINTA) 90 MG TABS tablet Take 1 tablet (90 mg total) by mouth 2 (two) times daily.    Allergies: Patient has no known allergies.  Social History    Tobacco Use  . Smoking status: Current Every Day Smoker    Packs/day: 0.50    Types: Cigarettes    Last attempt to quit: 04/05/2018    Years since quitting: 0.6  . Smokeless tobacco: Never Used  Substance Use Topics  . Alcohol use: Not Currently  . Drug use: Never    Family History  Problem Relation Age of Onset  . Cancer Mother     Review of Systems: A 12-system review of systems was performed and was negative except as noted in the HPI.  --------------------------------------------------------------------------------------------------  Physical Exam: BP 108/80 (BP Location: Left Arm, Patient Position: Sitting, Cuff Size: Large)   Pulse 80   Ht 5\' 11"  (1.803 m)   Wt (!) 312 lb 4 oz (141.6 kg)   BMI 43.55 kg/m   General: NAD. HEENT: No conjunctival pallor or scleral icterus. Moist mucous membranes.  OP clear. Neck: Supple without lymphadenopathy, thyromegaly, JVD, or HJR. Lungs: Normal work of breathing. Clear to auscultation bilaterally without wheezes or crackles. Heart: Regular rate and rhythm without murmurs, rubs, or gallops.  Unable to assess PMI due to body habitus. Abd: Bowel sounds present. Soft, NT/ND.  Unable to assess HSM due to body habitus. Ext: No lower extremity edema. Radial, PT, and DP pulses are 2+ bilaterally. Skin: Warm and dry without rash.  EKG: Normal sinus rhythm with lateral T wave inversions.  No significant change from prior tracing on 09/12/2018.  Lab Results  Component Value Date   WBC 9.7 04/06/2018   HGB 16.7 04/06/2018   HCT 52.5 (H) 04/06/2018   MCV 82.9 04/06/2018   PLT 163 04/06/2018    Lab Results  Component Value Date   NA 137 08/12/2018   K 4.0 08/12/2018   CL 106 08/12/2018   CO2 26 08/12/2018   BUN 24 (H) 08/12/2018   CREATININE 1.27 (H) 08/12/2018   GLUCOSE 72 08/12/2018   ALT 32 06/30/2018    Lab Results  Component Value Date   CHOL 87 06/30/2018   HDL 42 06/30/2018   LDLCALC 35 06/30/2018   TRIG 51  06/30/2018   CHOLHDL 2.1 06/30/2018    --------------------------------------------------------------------------------------------------  ASSESSMENT AND PLAN: Coronary artery disease No symptoms to suggest worsening coronary insufficiency.  Continue aggressive secondary prevention including at least 12 months of dual antiplatelet therapy with aspirin and ticagrelor.  HFpEF Patient appears euvolemic and well compensated with NYHA class I symptoms.  Continue as needed furosemide.  LVEF low normal by echo in early October.  Hypertension Blood pressure well controlled.  Continue low-dose carvedilol.  Hyperlipidemia LDL at goal.  Continue atorvastatin 40 mg daily.  Preoperative cardiovascular risk assessment I think it is reasonable for [name] Schmidt to proceed with elective dental surgery.  I ask that aspirin and ticagrelor be continued in the perioperative period, if possible.  If ticagrelor must be stopped, I recommend delaying any invasive procedure until mid April unless there is significant risk  for morbidity or mortality by delaying the procedure.  I advocate for avoiding epinephrine, if possible.  Should epinephrine be needed to achieve hemostasis during dental procedures, it should be used judiciously.  Follow-up: Return to clinic in early to mid April, 2020.  Yvonne Kendallhristopher Lenoir Facchini, MD 12/09/2018 3:36 PM

## 2018-12-09 NOTE — Patient Instructions (Signed)
Medication Instructions:  Your physician recommends that you continue on your current medications as directed. Please refer to the Current Medication list given to you today.  If you need a refill on your cardiac medications before your next appointment, please call your pharmacy.   Lab work: none If you have labs (blood work) drawn today and your tests are completely normal, you will receive your results only by: [name]. MyChart Message (if you have MyChart) OR . A paper copy in the mail If you have any lab test that is abnormal or we need to change your treatment, we will call you to review the results.  Testing/Procedures: none  Follow-Up: At [facility], you and your health needs are our priority.  As part of our continuing mission to provide you with exceptional heart care, we have created designated Provider Care Teams.  These Care Teams include your primary Cardiologist (physician) and Advanced Practice Providers (APPs -  Physician Assistants and Nurse Practitioners) who all work together to provide you with the care you need, when you need it. You will need a follow up appointment in 4 months.  Please call our office 2 months in advance to schedule this appointment.  You may see Yvonne Kendallhristopher End, MD or one of the following Advanced Practice Providers on your designated Care Team:   Nicolasa Duckinghristopher Berge, NP Eula Listenyan Dunn, PA-C . Marisue IvanJacquelyn Visser, PA-C  Any Other Special Instructions Will Be Listed Below (If Applicable).  Your physician recommends that you stop smoking.     Steps to Quit Smoking  Smoking tobacco can be bad for your health. It can also affect almost every organ in your body. Smoking puts you and people around you at risk for many serious long-lasting (chronic) diseases. Quitting smoking is hard, but it is one of the best things that you can do for your health. It is never too late to quit. What are the benefits of quitting smoking? When you quit smoking, you lower your  risk for getting serious diseases and conditions. They can include:  Lung cancer or lung disease.  Heart disease.  Stroke.  Heart attack.  Not being able to have children (infertility).  Weak bones (osteoporosis) and broken bones (fractures). If you have coughing, wheezing, and shortness of breath, those symptoms may get better when you quit. You may also get sick less often. If you are pregnant, quitting smoking can help to lower your chances of having a baby of low birth weight. What can I do to help me quit smoking? Talk with your doctor about what can help you quit smoking. Some things you can do (strategies) include:  Quitting smoking totally, instead of slowly cutting back how much you smoke over a period of time.  Going to in-person counseling. You are more likely to quit if you go to many counseling sessions.  Using resources and support systems, such as: ? Agricultural engineernline chats with a Veterinary surgeoncounselor. ? Phone quitlines. ? Automotive engineerrinted self-help materials. ? Support groups or group counseling. ? Text messaging programs. ? Mobile phone apps or applications.  Taking medicines. Some of these medicines may have nicotine in them. If you are pregnant or breastfeeding, do not take any medicines to quit smoking unless your doctor says it is okay. Talk with your doctor about counseling or other things that can help you. Talk with your doctor about using more than one strategy at the same time, such as taking medicines while you are also going to in-person counseling. This can help make  quitting easier. What things can I do to make it easier to quit? Quitting smoking might feel very hard at first, but there is a lot that you can do to make it easier. Take these steps:  Talk to your family and friends. Ask them to support and encourage you.  Call phone quitlines, reach out to support groups, or work with a Veterinary surgeon.  Ask people who smoke to not smoke around you.  Avoid places that make you want  (trigger) to smoke, such as: ? Bars. ? Parties. ? Smoke-break areas at work.  Spend time with people who do not smoke.  Lower the stress in your life. Stress can make you want to smoke. Try these things to help your stress: ? Getting regular exercise. ? Deep-breathing exercises. ? Yoga. ? Meditating. ? Doing a body scan. To do this, close your eyes, focus on one area of your body at a time from head to toe, and notice which parts of your body are tense. Try to relax the muscles in those areas.  Download or buy apps on your mobile phone or tablet that can help you stick to your quit plan. There are many free apps, such as QuitGuide from the [employer] developer for Disease Control and Prevention). You can find more support from smokefree.gov and other websites. This information is not intended to replace advice given to you by your health care provider. Make sure you discuss any questions you have with your health care provider. Document Released: 10/03/2009 Document Revised: 08/04/2016 Document Reviewed: 04/23/2015 Elsevier Interactive Patient Education  2019 [employer].

## 2018-12-12 ENCOUNTER — Telehealth: Payer: Self-pay | Admitting: *Deleted

## 2018-12-12 NOTE — Telephone Encounter (Signed)
Patient provided Dr End with clearance form at office visit on 12/09/18. Dr End completed and signed form and faxed to Dental office along with office visit note.

## 2018-12-28 ENCOUNTER — Other Ambulatory Visit: Payer: Self-pay | Admitting: Internal Medicine

## 2019-01-03 ENCOUNTER — Other Ambulatory Visit (HOSPITAL_COMMUNITY): Payer: Self-pay | Admitting: Internal Medicine

## 2019-01-03 ENCOUNTER — Other Ambulatory Visit: Payer: Self-pay | Admitting: Internal Medicine

## 2019-01-03 NOTE — Telephone Encounter (Signed)
Rx filled thru e scribe.

## 2019-01-03 NOTE — Telephone Encounter (Signed)
°*  STAT* If patient is at the pharmacy, call can be transferred to refill team.   1. Which medications need to be refilled? (please list name of each medication and dose if known) Brilinta 90 mg po BID  2. Which pharmacy/location (including street and city if local pharmacy) is medication to be sent to? [id number] - [place], [place] - [address]  3. Do they need a 30 day or 90 day supply? 90

## 2019-04-07 ENCOUNTER — Telehealth: Payer: Self-pay | Admitting: *Deleted

## 2019-04-07 NOTE — Telephone Encounter (Signed)
Virtual Visit Pre-Appointment Phone Call  Steps For Call:  Confirm consent - "In the setting of the current Covid19 crisis, you are scheduled for a (video) visit with your provider on (04/17/2019) at (11:00 am ).  Just as we do with many in-office visits, in order for you to participate in this visit, we must obtain consent.  If you'd like, I can send this to your mychart (if signed up) or email for you to review.  Otherwise, I can obtain your verbal consent now.  All virtual visits are billed to your insurance company just like a normal visit would be.  By agreeing to a virtual visit, we'd like you to understand that the technology does not allow for your provider to perform an examination, and thus may limit your provider's ability to fully assess your condition. If your provider identifies any concerns that need to be evaluated in person, we will make arrangements to do so.  Finally, though the technology is pretty good, we cannot assure that it will always work on either your or our end, and in the setting of a video visit, we may have to convert it to a phone-only visit.  In either situation, we cannot ensure that we have a secure connection.  Are you willing to proceed?" YES 1. Confirm the BEST phone number to call the day of the visit by including in appointment notes  2. Give patient instructions for WebEx/MyChart download to smartphone as below or Doximity/Doxy.me if video visit (depending on what platform provider is using)  3. Advise patient to be prepared with their blood pressure, heart rate, weight, any heart rhythm information, their current medicines, and a piece of paper and pen handy for any instructions they may receive the day of their visit  4. Inform patient they will receive a phone call 15 minutes prior to their appointment time (may be from unknown caller ID) so they should be prepared to answer  5. Confirm that appointment type is correct in Epic appointment notes (VIDEO  vs PHONE)     TELEPHONE CALL NOTE  [name] Schmidt has been deemed a candidate for a follow-up tele-health visit to limit community exposure during the Covid-19 pandemic. I spoke with the patient via phone to ensure availability of phone/video source, confirm preferred email & phone number, and discuss instructions and expectations.  I reminded [name] Schmidt to be prepared with any vital sign and/or heart rhythm information that could potentially be obtained via home monitoring, at the time of his visit. I reminded [name] Schmidt to expect a phone call at the time of his visit if his visit.  [name] Schmidt C, CMA 04/07/2019 11:38 AM   INSTRUCTIONS FOR DOWNLOADING THE WEBEX APP TO SMARTPHONE  - If Apple, ask patient to go to [employer] and type in WebEx in the search bar. Download [employer], the blue/green circle. If Android, go to [employer] and type in [employer] in the search bar. The app is free but as with any other app downloads, their phone may require them to verify saved payment information or Apple/Android password.  - The patient does NOT have to create an account. - On the day of the visit, the assist will walk the patient through joining the meeting with the meeting number/password.  INSTRUCTIONS FOR DOWNLOADING THE MYCHART APP TO SMARTPHONE  - The patient must first make sure to have activated MyChart and know their login information - If Apple, go to [employer]  and type in MyChart in the search bar and download the app. If Android, ask patient to go to [employer] and type in [place] in the search bar and download the app. The app is free but as with any other app downloads, their phone may require them to verify saved payment information or Apple/Android password.  - The patient will need to then log into the app with their MyChart username and password, and select [place] as their healthcare provider to link the account. When it is time for your visit, go to the  MyChart app, find appointments, and click Begin Video Visit. Be sure to Select Allow for your device to access the Microphone and Camera for your visit. You will then be connected, and your provider will be with you shortly.  **If they have any issues connecting, or need assistance please contact MyChart service desk [phone])**  **If using a computer, in order to ensure the best quality for their visit they will need to use either of the following Internet Browsers: [employer], or Google Chrome**  IF USING DOXIMITY or DOXY.ME - The patient will receive a link just prior to their visit, either by text or email (to be determined day of appointment depending on if it's doxy.me or Doximity).     FULL LENGTH CONSENT FOR TELE-HEALTH VISIT   I hereby voluntarily request, consent and authorize [facility] and its employed or contracted physicians, physician assistants, nurse practitioners or other licensed health care professionals (the Practitioner), to provide me with telemedicine health care services (the "Services") as deemed necessary by the treating Practitioner. I acknowledge and consent to receive the Services by the Practitioner via telemedicine. I understand that the telemedicine visit will involve communicating with the Practitioner through live audiovisual communication technology and the disclosure of certain medical information by electronic transmission. I acknowledge that I have been given the opportunity to request an in-person assessment or other available alternative prior to the telemedicine visit and am voluntarily participating in the telemedicine visit.  I understand that I have the right to withhold or withdraw my consent to the use of telemedicine in the course of my care at any time, without affecting my right to future care or treatment, and that the Practitioner or I may terminate the telemedicine visit at any time. I understand that I have the right to  inspect all information obtained and/or recorded in the course of the telemedicine visit and may receive copies of available information for a reasonable fee.  I understand that some of the potential risks of receiving the Services via telemedicine include:  [name] Delay or interruption in medical evaluation due to technological equipment failure or disruption; . Information transmitted may not be sufficient (e.g. poor resolution of images) to allow for appropriate medical decision making by the Practitioner; and/or  . In rare instances, security protocols could fail, causing a breach of personal health information.  Furthermore, I acknowledge that it is my responsibility to provide information about my medical history, conditions and care that is complete and accurate to the best of my ability. I acknowledge that Practitioner's advice, recommendations, and/or decision may be based on factors not within their control, such as incomplete or inaccurate data provided by me or distortions of diagnostic images or specimens that may result from electronic transmissions. I understand that the practice of medicine is not an exact science and that Practitioner makes no warranties or guarantees regarding treatment outcomes. I acknowledge that I will  receive a copy of this consent concurrently upon execution via email to the email address I last provided but may also request a printed copy by calling the office of [place].    I understand that my insurance will be billed for this visit.   I have read or had this consent read to me. . I understand the contents of this consent, which adequately explains the benefits and risks of the Services being provided via telemedicine.  . I have been provided ample opportunity to ask questions regarding this consent and the Services and have had my questions answered to my satisfaction. . I give my informed consent for the services to be provided through the use of  telemedicine in my medical care  By participating in this telemedicine visit I agree to the above.

## 2019-04-12 ENCOUNTER — Ambulatory Visit: Payer: Self-pay | Admitting: Internal Medicine

## 2019-04-17 ENCOUNTER — Encounter: Payer: Self-pay | Admitting: Internal Medicine

## 2019-04-17 ENCOUNTER — Other Ambulatory Visit: Payer: Self-pay

## 2019-04-17 ENCOUNTER — Telehealth (INDEPENDENT_AMBULATORY_CARE_PROVIDER_SITE_OTHER): Payer: BLUE CROSS/BLUE SHIELD | Admitting: Internal Medicine

## 2019-04-17 VITALS — BP 117/82 | HR 84 | Ht 71.0 in | Wt 318.0 lb

## 2019-04-17 DIAGNOSIS — E785 Hyperlipidemia, unspecified: Secondary | ICD-10-CM

## 2019-04-17 DIAGNOSIS — I10 Essential (primary) hypertension: Secondary | ICD-10-CM | POA: Diagnosis not present

## 2019-04-17 DIAGNOSIS — I5032 Chronic diastolic (congestive) heart failure: Secondary | ICD-10-CM

## 2019-04-17 DIAGNOSIS — I2510 Atherosclerotic heart disease of native coronary artery without angina pectoris: Secondary | ICD-10-CM | POA: Diagnosis not present

## 2019-04-17 MED ORDER — FUROSEMIDE 20 MG PO TABS: 20.0000 mg | ORAL_TABLET | Freq: Every day | ORAL | 0 refills | Status: DC | PRN

## 2019-04-17 MED ORDER — CLOPIDOGREL BISULFATE 75 MG PO TABS: 75.0000 mg | ORAL_TABLET | Freq: Every day | ORAL | 3 refills | Status: DC

## 2019-04-17 NOTE — Progress Notes (Signed)
Virtual Visit via Video Note   This visit type was conducted due to national recommendations for restrictions regarding the COVID-19 Pandemic (e.g. social distancing) in an effort to limit this patient's exposure and mitigate transmission in our community.  Due to his co-morbid illnesses, this patient is at least at moderate risk for complications without adequate follow up.  This format is felt to be most appropriate for this patient at this time.  All issues noted in this document were discussed and addressed.  A limited physical exam was performed with this format.  Please refer to the patient's chart for his consent to telehealth for [facility].   Evaluation Performed:  Follow-up visit  Date:  04/17/2019   ID:  [name] Schmidt, DOB 22-Oct-1966, MRN [id number]  Patient Location: Home Provider Location: Home  PCP:  Enid Skeens., MD  Cardiologist:  Nelva Bush, MD  Electrophysiologist:  None   Chief Complaint:  Follow-up coronary artery disease  History of Present Illness:    [name] Schmidt is a 53 y.o. male with history of coronary artery disease with lateral STEMI on 04/04/18, hypertension, hyperlipidemia, GERD, tobacco use, and morbid obesity.  We are speaking today for follow-up of his coronary artery disease.  I last saw him in December, at which time he was doing well other than occasional swelling in his hands.  We did not make any medication changes at that time.  Today, [name] Schmidt reports that he feels relatively well.  He has not had any chest pain but experienced shortness of breath about a month ago with activity.  He notes that he had stopped smoking shortly before then and had put on quite a bit of weight.  He has lost 5-10 pounds and feels like his breathing is improving.  He denies orthopnea, palpitations, and lightheadedness.  At times, [name] Schmidt will note swelling/tightness in his fingers, for which he will use furosemide.  He is taking furosemide 20 mg daily on  average about 3 days per week.  He is tolerating his current medications well. He is scheduled for a physical with labs through his PCP on 04/28/2019.  The patient does not have symptoms concerning for COVID-19 infection (fever, chills, cough, or new shortness of breath).    Past Medical History:  Diagnosis Date  . (HFpEF) heart failure with preserved ejection fraction ([place])   . CKD (chronic kidney disease), stage III ([place])   . Coronary artery disease    a. 03/2018 Lateral STEMI/PCI: LMnl, LAD 20p, 39m D2 100ost (2.25x12 Resolute Onyx DES), LCX large/nl, OM1/2 large/nl, RCA large, min irregs, RPDA/RPAV large/nl.  . GERD (gastroesophageal reflux disease)   . Hyperlipidemia LDL goal <70   . Hypertension   . Morbid obesity ([place]   . STEMI (ST elevation myocardial infarction) ([place]    4/19 PCI/DES x1 to D2.  . Tobacco abuse    Past Surgical History:  Procedure Laterality Date  . CORONARY/GRAFT ACUTE MI REVASCULARIZATION N/A 04/04/2018   Procedure: Coronary/Graft Acute MI Revascularization;  Surgeon: ENelva Bush MD;  [facility];  Service: Cardiovascular;  Laterality: N/A;  . LEFT HEART CATH AND CORONARY ANGIOGRAPHY N/A 04/04/2018   Procedure: LEFT HEART CATH AND CORONARY ANGIOGRAPHY;  Surgeon: ENelva Bush MD;  [facility];  Service: Cardiovascular;  Laterality: N/A;     Current Meds  Medication Sig  . aspirin 81 MG chewable tablet Chew 1 tablet (81 mg total) by mouth daily.  .Marland Kitchenatorvastatin (LIPITOR) 40 MG  tablet Take 1 tablet (40 mg total) by mouth daily.  [name] BRILINTA 90 MG TABS tablet TAKE 1 TABLET (90 MG TOTAL) BY MOUTH 2 (TWO) TIMES DAILY.  . carvedilol (COREG) 3.125 MG tablet TAKE 1 TABLET (3.125 MG TOTAL) BY MOUTH 2 (TWO) TIMES DAILY WITH A MEAL.  . furosemide (LASIX) 20 MG tablet Take 1 tablet (20 mg) by mouth once a day for 1 week, then take 1 tablet by mouth daily AS NEEDED for swelling or weight gain. (Patient taking differently: 1 tablet  by mouth daily AS NEEDED for swelling or weight gain.)  . nitroGLYCERIN (NITROSTAT) 0.4 MG SL tablet Place 1 tablet (0.4 mg total) under the tongue every 5 (five) minutes as needed.  [name] omeprazole (PRILOSEC) 40 MG capsule Take 40 mg by mouth daily.  . tamsulosin (FLOMAX) 0.4 MG CAPS capsule Take 0.4 mg by mouth daily.     Allergies:   Patient has no known allergies.   Social History   Tobacco Use  . Smoking status: Current Every Day Smoker    Packs/day: 0.50    Types: Cigarettes  . Smokeless tobacco: Never Used  Substance Use Topics  . Alcohol use: Not Currently  . Drug use: Never     Family Hx: The patient's family history includes Cancer in his mother.  ROS:   Please see the history of present illness.   All other systems reviewed and are negative.   Prior CV studies:   The following studies were reviewed today:  Limited echo (09/22/2018): Normal LV size with mild LVH.  LVEF 50-55%.  Mild mitral regurgitation.  Normal RV size and function.  Echo (04/05/2018): Normal LV size with moderate LVH.  LVEF difficult to assess due to suboptimal acoustic windows.  Mild left atrial enlargement.  Mild RV dilation.  LHC/PCI (04/04/2018): LMCA normal.  LAD with 20% proximal and 40% mid stenoses.  Dominant D2 branch with percent thrombotic occlusion proximally.  LCx normal.  Dominant RCA with minimal diffuse disease.  Successful PCI to D2 using Resolute Onyx 2.25 x 12 mm DES.  Labs/Other Tests and Data Reviewed:    EKG:  No ECG reviewed.  Recent Labs: 06/30/2018: ALT 32; B Natriuretic Peptide 51.0 08/12/2018: BUN 24; Creatinine, Ser 1.27; Potassium 4.0; Sodium 137   Recent Lipid Panel Lab Results  Component Value Date/Time   CHOL 87 06/30/2018 03:49 AM   TRIG 51 06/30/2018 03:49 AM   HDL 42 06/30/2018 03:49 AM   CHOLHDL 2.1 06/30/2018 03:49 AM   LDLCALC 35 06/30/2018 03:49 AM    Wt Readings from Last 3 Encounters:  04/17/19 (!) 318 lb (144.2 kg)  12/09/18 (!) 312 lb 4 oz (141.6  kg)  09/12/18 (!) 306 lb 12 oz (139.1 kg)     Objective:    Vital Signs:  BP 117/82 (BP Location: Left Arm, Patient Position: Sitting, Cuff Size: Normal)   Pulse 84   Ht '5\' 11"'$  (1.803 m)   Wt (!) 318 lb (144.2 kg)   BMI 44.35 kg/m    VITAL SIGNS:  reviewed GEN:  no acute distress CARDIOVASCULAR:  no peripheral edema  ASSESSMENT & PLAN:    Coronary artery disease: No chest pain, now >12 months from lateral STEMI with primary PCI to diagonal branch.  We discussed the risks and benefits of indefinite DAPT and have agreed to continue this long-term.  However, we will switch from ticagrelor to clopidogrel 75 mg daily when [name] Schmidt has exhausted his current supply.  He should remain  on atorvastatin 40 mg daily for secondary prevention.  Chronic HFpEF: Weight is up from prior visits with some shortness of breath noted a month ago.  However, it sounds like this was due to weight gain associated with tobacco cessation.  We will continue to use prn furosemide.  BP reasonably well-controlled, though diastolic is borderline elevated.  Hypertension: Continue current medications.  Sodium restriction was encouraged.  Hyperlipidemia: Most recent LDL in 06/2018 was excellent (goal LDL < 70).  We will continue atorvastatin 40 mg daily.  I asked [name] Schmidt to ensure that his PCP checks a lipid panel and LFT's as part of his physical.  Otherwise, we will plan to check these labs when [name] Schmidt follows up with [place] in ~4 months.  COVID-19 Education: The signs and symptoms of COVID-19 were discussed with the patient and how to seek care for testing (follow up with PCP or arrange E-visit).  The importance of social distancing was discussed today.  Time:   Today, I have spent 12 minutes with the patient with telehealth technology discussing the above problems.  An additional 10 minutes were spent reviewing the patient's chart and documenting today's encounter.  Medication Adjustments/Labs and Tests  Ordered: Current medicines are reviewed at length with the patient today.  Concerns regarding medicines are outlined above.   Tests Ordered: Orders Placed This Encounter  Procedures  . Comp Met (CMET)  . Lipid Profile    Medication Changes: Meds ordered this encounter  Medications  . furosemide (LASIX) 20 MG tablet    Sig: Take 1 tablet (20 mg total) by mouth daily as needed (for weight gain and/or swelling).    Dispense:  90 tablet    Refill:  0  . clopidogrel (PLAVIX) 75 MG tablet    Sig: Take 1 tablet (75 mg total) by mouth daily.    Dispense:  90 tablet    Refill:  3    Will be stopping brilinta    Disposition:  Follow up in 4 month(s)  Signed, Nelva Bush, MD  04/17/2019 10:53 AM    [facility]

## 2019-04-17 NOTE — Patient Instructions (Signed)
Medication Instructions:  - Your physician has recommended you make the following change in your medication:   1) Finish your current supply of brilinta (ticagrelor), then  2) Start plavix (clopidogrel) 75 mg- take 1 tablet by mouth once daily  If you need a refill on your cardiac medications before your next appointment, please call your pharmacy.   Lab work: - Your physician recommends that you have lab work with you next Primary Care visit: Complete Metabolic Panel & Lipid Panel - please ask that they fax these results to our office at [phone].   If you have labs (blood work) drawn today and your tests are completely normal, you will receive your results only by: [name] MyChart Message (if you have MyChart) OR . A paper copy in the mail If you have any lab test that is abnormal or we need to change your treatment, we will call you to review the results.  Testing/Procedures: - none ordered  Follow-Up: At [facility], you and your health needs are our priority.  As part of our continuing mission to provide you with exceptional heart care, we have created designated Provider Care Teams.  These Care Teams include your primary Cardiologist (physician) and Advanced Practice Providers (APPs -  Physician Assistants and Nurse Practitioners) who all work together to provide you with the care you need, when you need it. You will need a follow up appointment in 4 months.    Please call our office 2 months in advance to schedule this appointment. (Call in early June to schedule).   You may see Yvonne Kendall, MD or one of the following Advanced Practice Providers on your designated Care Team:   Nicolasa Ducking, NP Eula Listen, PA-C . Marisue Ivan, PA-C  Any Other Special Instructions Will Be Listed Below (If Applicable). - N/A

## 2019-04-28 ENCOUNTER — Ambulatory Visit: Payer: Self-pay | Admitting: Internal Medicine

## 2019-05-08 ENCOUNTER — Encounter: Payer: Self-pay | Admitting: Physician Assistant

## 2019-05-08 ENCOUNTER — Encounter: Payer: Self-pay | Admitting: Internal Medicine

## 2019-05-12 ENCOUNTER — Encounter: Payer: Self-pay | Admitting: Internal Medicine

## 2019-05-17 ENCOUNTER — Telehealth: Payer: Self-pay | Admitting: Internal Medicine

## 2019-05-17 NOTE — Telephone Encounter (Signed)
Notes recorded by End, Cristal Deer, MD on 05/12/2019 at 3:28 PM EDT LDL at goal. Normal ALT. Alkaline phosphatase noted to be elevated. No medication changes at this time. Further w/u of elevated alkaline phosphatase per PCP.

## 2019-05-17 NOTE — Telephone Encounter (Signed)
Attempted to call the patient. No answer- I left a message to please call back.  

## 2019-05-17 NOTE — Telephone Encounter (Signed)
I spoke with the patient regarding in his lab results from [facility].  He is aware Dr. Okey Dupre has reviewed these and had no further recommendations.  He states primary care had notified him of these results as well and they were contacting [facility] to see is another test could be added on to address his elevated alkaline phosphatase.   The patient voiced understanding of the above and was agreeable.

## 2019-06-20 ENCOUNTER — Telehealth: Payer: Self-pay

## 2019-06-20 MED ORDER — ATORVASTATIN CALCIUM 40 MG PO TABS: 40.0000 mg | ORAL_TABLET | Freq: Every day | ORAL | 0 refills | Status: DC

## 2019-06-20 NOTE — Telephone Encounter (Signed)
Requested Prescriptions   Signed Prescriptions Disp Refills   atorvastatin (LIPITOR) 40 MG tablet 90 tablet 0    Sig: Take 1 tablet (40 mg total) by mouth daily.    Authorizing Provider: END, CHRISTOPHER    Ordering User: NEWCOMER MCCLAIN, Clarita Mcelvain L    

## 2019-07-13 ENCOUNTER — Other Ambulatory Visit: Payer: Self-pay | Admitting: Internal Medicine

## 2019-08-02 ENCOUNTER — Telehealth: Payer: Self-pay | Admitting: Internal Medicine

## 2019-08-02 NOTE — Telephone Encounter (Signed)
Pt c/o BP issue: STAT if pt c/o blurred vision, one-sided weakness or slurred speech  1. What are your last 5 BP readings? 150/99  138/95 136/92  2. Are you having any other symptoms (ex. Dizziness, headache, blurred vision, passed out)? Flushed in face pressure l side chest interim   3. What is your BP issue? Patient says bp trending up brilinta recently changed to plavix concerned this may be reason

## 2019-08-03 NOTE — Telephone Encounter (Signed)
Patient declined same day appt today due to work   Patient works second shift please do not call until after 10

## 2019-08-04 MED ORDER — FAMOTIDINE 20 MG PO TABS: 20.0000 mg | ORAL_TABLET | Freq: Two times a day (BID) | ORAL | 6 refills | Status: DC

## 2019-08-04 MED ORDER — CARVEDILOL 3.125 MG PO TABS: 6.2500 mg | ORAL_TABLET | Freq: Two times a day (BID) | ORAL | Status: DC

## 2019-08-04 NOTE — Telephone Encounter (Signed)
RX for famotidine 20 mg BID sent to [facility] in [place].

## 2019-08-04 NOTE — Telephone Encounter (Signed)
I spoke with the patient.  He states he finished his brilinta in late May/ early June and then switched to plavix. He is having an intermittent flushing sensation noticed since that time.   He did have some chest discomfort on Sunday as well.  He denies any more pain since that time. He took his BP Monday morning and it was 150/99, that night it was 138/95 and Tuesday morning he was 136/92.  The patient took his BP (HR) for me while on the phone and he was 141/94 (73). He is currently working 2nd shift and just woke up.  He confirms he takes coreg 3.125 mg BID around 12 pm being his 1st dose of the day.   He was also inquiring about the pharmacy stating there could be some type of interaction between the plavix and prilosec.  I advised this could be a possibility.   I advised the patient I would forward the above information to Dr. Saunders Revel to review and we will call him back with further recommendations.  The patient is aware and agreeable.

## 2019-08-04 NOTE — Telephone Encounter (Signed)
I do not believe that changing ticagrelor to clopidogrel would have a significant impact on the patient's blood pressure.  Flushing would also be an unusual side effect (though I cannot entirely exclude it).  Potential for interaction between clopidogrel and omeprazole is debatable, with some studies suggesting decreased efficacy of clopidgrel's antiplatelet effects.  I recommend stopping omeprazole and increasing carvedilol to 6.25 mg BID.  If he has recurrent chest pain or other new symptoms, he should seek immediate medical attention.  We should make arrangements for office visit with me or APP next week.  Nelva Bush, MD [facility] Pager: [phone]

## 2019-08-04 NOTE — Telephone Encounter (Signed)
I spoke with the patient. He is aware of Dr. Darnelle Bos recommendations to stop prilosec and increase coreg to 6.25 mg BID. The patient states he just picked up his Coreg 3.125 mg RX so he has enough of this for now to take 2 tablets (6.25 mg) BID.  He is agreeable with being seen on 08/09/19 at 1:30 pm with Ignacia Bayley, NP.   He is asking what he can take in the place of the prilosec.  He states he tried just coming off of it once before and did not tolerate that well.  He is aware I will review with Dr. Saunders Revel and only call him back if we are unable to send an alternative to the pharmacy.  [facility] [place]

## 2019-08-04 NOTE — Telephone Encounter (Signed)
Patient returning call.

## 2019-08-04 NOTE — Telephone Encounter (Signed)
Attempted to contact the patient. °No answer- I left a message to please call back.  °

## 2019-08-04 NOTE — Telephone Encounter (Signed)
It is fine for [name] Schmidt to take 2 of his carvedilol 3.125 mg tablets twice a day.  In lieu of omeprazole, I recommend that he try famotidine 20 mg BID.  This will not interact with clopidogrel and also does not have the long-term side effects that can be associated with PPI use.  Nelva Bush, MD [facility] Pager: [phone]

## 2019-08-09 ENCOUNTER — Ambulatory Visit (INDEPENDENT_AMBULATORY_CARE_PROVIDER_SITE_OTHER): Payer: BC Managed Care – PPO | Admitting: Physician Assistant

## 2019-08-09 ENCOUNTER — Encounter: Payer: Self-pay | Admitting: Physician Assistant

## 2019-08-09 ENCOUNTER — Other Ambulatory Visit: Payer: Self-pay

## 2019-08-09 ENCOUNTER — Encounter: Payer: Self-pay | Admitting: Nurse Practitioner

## 2019-08-09 VITALS — BP 148/88 | HR 79 | Ht 71.0 in | Wt 325.0 lb

## 2019-08-09 DIAGNOSIS — I5032 Chronic diastolic (congestive) heart failure: Secondary | ICD-10-CM

## 2019-08-09 DIAGNOSIS — I10 Essential (primary) hypertension: Secondary | ICD-10-CM

## 2019-08-09 DIAGNOSIS — I2510 Atherosclerotic heart disease of native coronary artery without angina pectoris: Secondary | ICD-10-CM | POA: Diagnosis not present

## 2019-08-09 DIAGNOSIS — E785 Hyperlipidemia, unspecified: Secondary | ICD-10-CM

## 2019-08-09 DIAGNOSIS — R079 Chest pain, unspecified: Secondary | ICD-10-CM

## 2019-08-09 MED ORDER — CARVEDILOL 12.5 MG PO TABS: 12.5000 mg | ORAL_TABLET | Freq: Two times a day (BID) | ORAL | Status: DC

## 2019-08-09 NOTE — Patient Instructions (Signed)
Medication Instructions:  Your physician has recommended you make the following change in your medication:   INCREASE Carvedilol to 12.5mg  twice daily. An Rx has been sent to your pharmacy  If you need a refill on your cardiac medications before your next appointment, please call your pharmacy.   Lab work: None ordered If you have labs (blood work) drawn today and your tests are completely normal, you will receive your results only by: [name] MyChart Message (if you have MyChart) OR . A paper copy in the mail If you have any lab test that is abnormal or we need to change your treatment, we will call you to review the results.  Testing/Procedures: Your physician has requested that you have a lexiscan myoview. For further information please visit [email or web address]. Please follow instruction sheet, as given.    Follow-Up: At [facility], you and your health needs are our priority.  As part of our continuing mission to provide you with exceptional heart care, we have created designated Provider Care Teams.  These Care Teams include your primary Cardiologist (physician) and Advanced Practice Providers (APPs -  Physician Assistants and Nurse Practitioners) who all work together to provide you with the care you need, when you need it. You will need a follow up appointment in 4 weeks. You may see Nelva Bush, MD or one of the following Advanced Practice Providers on your designated Care Team:   Murray Hodgkins, NP Christell Faith, PA-C . Marrianne Mood, PA-C  Any Other Special Instructions Will Be Listed Below (If Applicable).  [place]  Your caregiver has ordered a Stress Test with nuclear imaging. The purpose of this test is to evaluate the blood supply to your heart muscle. This procedure is referred to as a "Non-Invasive Stress Test." This is because other than having an IV started in your vein, nothing is inserted or "invades" your body. Cardiac stress tests are done to find areas of  poor blood flow to the heart by determining the extent of coronary artery disease (CAD). Some patients exercise on a treadmill, which naturally increases the blood flow to your heart, while others who are  unable to walk on a treadmill due to physical limitations have a pharmacologic/chemical stress agent called Lexiscan . This medicine will mimic walking on a treadmill by temporarily increasing your coronary blood flow.   Please note: these test may take anywhere between 2-4 hours to complete  PLEASE REPORT TO [facility] AT THE FIRST DESK WILL DIRECT YOU WHERE TO GO  Date of Procedure:_____________________________________  Arrival Time for Procedure:______________________________  Instructions regarding medication:     __X__:  Hold betablocker(s) Carvedilol night before procedure and morning of procedure  __X__:  Hold other medications as follows:_____Do not take Lasix the morning of your test ____________________________________________________________________________________________________________________________________________________________________________________________________________________________________________________________________________________  PLEASE NOTIFY THE OFFICE AT LEAST 24 HOURS IN ADVANCE IF YOU ARE UNABLE TO KEEP YOUR APPOINTMENT.  [phone] AND  PLEASE NOTIFY NUCLEAR MEDICINE AT [facility] AT LEAST 24 HOURS IN ADVANCE IF YOU ARE UNABLE TO KEEP YOUR APPOINTMENT. [phone]  How to prepare for your Myoview test:  1. Do not eat or drink after midnight 2. No caffeine for 24 hours prior to test 3. No smoking 24 hours prior to test. 4. Your medication may be taken with water.  If your doctor stopped a medication because of this test, do not take that medication. 5. Ladies, please do not wear dresses.  Skirts or pants are appropriate. Please wear a short  sleeve shirt. 6. No perfume, cologne or lotion. 7. Wear comfortable walking  shoes. No heels!

## 2019-08-09 NOTE — Progress Notes (Signed)
Office Visit    Patient Name: [name] Schmidt Date of Encounter: 08/09/2019  Primary Care Provider:  Enid Skeens., MD Primary Cardiologist:  Nelva Bush, MD  Chief Complaint    53 year old male with history of CAD with lateral STEMI on 04/04/2018, hypertension, hyperlipidemia, GERD, former tobacco use, and morbid obesity who presents for follow-up of coronary artery disease and recent c/o CP and elevated blood pressure.    Past Medical History    Past Medical History:  Diagnosis Date   (HFpEF) heart failure with preserved ejection fraction (HCC)    CKD (chronic kidney disease), stage III ([place])    Coronary artery disease    a. 03/2018 Lateral STEMI/PCI: LMnl, LAD 20p, 52m D2 100ost (2.25x12 Resolute Onyx DES), LCX large/nl, OM1/2 large/nl, RCA large, min irregs, RPDA/RPAV large/nl.   GERD (gastroesophageal reflux disease)    Hyperlipidemia LDL goal <70    Hypertension    Morbid obesity ([place]    STEMI (ST elevation myocardial infarction) ([place]    4/19 PCI/DES x1 to D2.   Tobacco abuse    Past Surgical History:  Procedure Laterality Date   CORONARY/GRAFT ACUTE MI REVASCULARIZATION N/A 04/04/2018   Procedure: Coronary/Graft Acute MI Revascularization;  Surgeon: ENelva Bush MD;  [facility];  Service: Cardiovascular;  Laterality: N/A;   LEFT HEART CATH AND CORONARY ANGIOGRAPHY N/A 04/04/2018   Procedure: LEFT HEART CATH AND CORONARY ANGIOGRAPHY;  Surgeon: ENelva Bush MD;  [facility];  Service: Cardiovascular;  Laterality: N/A;    Allergies  No Known Allergies  History of Present Illness    53yo male with PMH as above and seen in the office today for follow-up of CAD s/p 03/2018 lateral STEMI and recent c/o CP and elevated blood pressure. 2019 limited echocardiogram showed low to normal left ventricular systolic function with EF 50 to 55% and mild mitral regurgitation.  He was seen in the office 11/2018 with no CP  or SOB and only occasional swelling in his hands, for which he used furosemide every few weeks. He was euvolemic on exam. BP was well controlled on Coreg 3.1228mBID. He was exercising regularly at that time with estimated gym visits 3 days a week.  He reported mostly strength training and a limited amount of cardiovascular exercise.  He had restarted smoking but was again trying to quit.  No medication changes were made. On 04/17/2019, he had a virtual visit and continued to deny CP but reported new DOE that started approximately 1 month prior (02/2019) and after recent weight gain s/p quitting smoking. He then lost 5 to 10 pounds with improved breathing status.  He had borderline elevated DBP and noted continued swelling and tightness in his fingers, for which he used PRN Lasix 20 mg qd on an average of 3 days/week.  It was recommended he switch to Plavix 75 mg daily after running out of his current ticagrelor for continued DAPT. Due to elevated alkaline phosphatase on most recent labs, further workup was recommended per PCP (labs planned for 09/2019 per patient).  On 08/02/2019, he called the office with elevated SBP into the 150s and elevated DBP 90-99.  Associated symptoms included facial flushing.  He also noted new intermittent chest discomfort.  He declined same-day appointment with the appointment scheduled for the following week.  In the meantime, it was recommended he increase his Coreg to 6.25 mg twice daily.  It was also recommended he stop omeprazole and start famotidine 20  mg twice daily as there was no known interaction with clopidogrel and no long-term side effects that were associated with PPI use.  In clinic today, patient denied current chest pain but reported intermittent chest pain that occurred both at rest and with exertion.  The chest pain was further reported as a tight band across his chest of unknown duration.  He reported he did not take his sublingual nitroglycerin for this chest pain,  as he attributed it to his recently elevated blood pressure; however, he also admitted that the CP had started to occur at times his BP was not elevated.  He denied association with food. No associated palpitations. He did note subjective racing heart rate at times (when check of HR, rates in the 80s). He continued to report elevated SBP and DBP with only slight improvement in pressures s/p increasing his Coreg to 6.5 mg twice daily last week.  He denied any orthopnea or early satiety and reported stable DOE since his last virtual visit, only noting increased SOB in humidity or with exertion especially with a mask. No recent LEE, even when wearing steel toed boots at work. He had not recently used his lasix, as he attributed his weight gain to consumption of food rather than volume status. He confirmed that he had not restarted smoking. He reported a heart healthy diet with grilled chicken and vegetables and 40 to 60 ounces of water daily.  He denied adding additional salt to his food.  He had stopped working out regularly as mentioned above at his April 2020 office visit and admitted he had been relatively sedentary for some time now.  He noted increased fatigue, similar to the fatigue felt prior to his lateral STEMI on 03/2018.  He had difficulty "even getting up to come to his afternoon cardiology appointment," which was unusual for him.  He noted increased work stress but otherwise no acute changes in his social life. No s/sx of bleeding on DAPT. Medication compliance was confirmed.   Home Medications    Prior to Admission medications   Medication Sig Start Date End Date Taking? Authorizing Provider  aspirin 81 MG chewable tablet Chew 1 tablet (81 mg total) by mouth daily. 04/07/18  Yes Cheryln Manly, NP  atorvastatin (LIPITOR) 40 MG tablet Take 1 tablet (40 mg total) by mouth daily. 06/20/19 09/18/19 Yes End, Harrell Gave, MD  carvedilol (COREG) 3.125 MG tablet Take 2 tablets (6.25 mg total) by mouth 2  (two) times daily with a meal. 08/04/19  Yes End, Harrell Gave, MD  clopidogrel (PLAVIX) 75 MG tablet Take 1 tablet (75 mg total) by mouth daily. 04/17/19  Yes End, Harrell Gave, MD  famotidine (PEPCID) 20 MG tablet Take 1 tablet (20 mg total) by mouth 2 (two) times daily. 08/04/19  Yes End, Harrell Gave, MD  furosemide (LASIX) 20 MG tablet TAKE 1 TABLET BY MOUTH DAILY AS NEEDED (FOR WEIGHT GAIN AND/OR SWELLING). 07/13/19  Yes End, Harrell Gave, MD  nitroGLYCERIN (NITROSTAT) 0.4 MG SL tablet Place 1 tablet (0.4 mg total) under the tongue every 5 (five) minutes as needed. 04/06/18  Yes Reino Bellis B, NP  tamsulosin (FLOMAX) 0.4 MG CAPS capsule Take 0.4 mg by mouth daily. 01/02/18  Yes [provider]    Review of Systems    He denies palpitations, pnd, orthopnea, n, v, dizziness, syncope, edema.  All other systems reviewed and are otherwise negative except as noted above.  Physical Exam    VS:  BP (!) 148/88 (BP Location: Left Arm, Patient  Position: Sitting, Cuff Size: Normal)    Pulse 79    Ht _0  (1.803 m)    Wt (!) 325 lb (147.4 kg)    SpO2 98%    BMI 45.33 kg/m  , BMI Body mass index is 45.33 kg/m. TFT:DDUKG male in no acute distress. HEENT: normal. Neck: Supple, JVD difficult to assess d/t body habitus, carotid bruits, or masses. Cardiac: RRR, no murmurs, rubs, or gallops. No clubbing, cyanosis, edema.  Radials/DP/PT 2+ and equal bilaterally.  Respiratory:  Respirations regular and unlabored, clear to auscultation bilaterally. GI: Soft, nontender, nondistended, BS + x 4. MS: no deformity or atrophy. Skin: warm and dry, no rash. Neuro:  Strength and sensation are intact. Psych: Normal affect.  Accessory Clinical Findings    ECG personally reviewed by me today - SR, lateral TWI present on previous EKG 11/2018. TWI in V1 / V2 present in 07/2018 EKG. - no acute changes.  Assessment & Plan    Chest Pain with history of Coronary artery disease s/p 03/2018 STEMI --No current CP.  Chest pain within last week noted by patient. Occurs intermittently at both rest and with exertion. Noted that CP associated with elevated BP but not always. Associated symptoms include fatigue, which he noted as a sx prior to his previous STEMI. Occasional feelings of subjective racing HR (80s when checked). SOB/DOE stable from previous visit. Now > 12 months from lateral STEMI with primary PCI to diagonal branch.  Recently switched from ticagrelor to clopidogrel 75 mg daily in late May / early June. No s/sx of bleeding including melena, hematuria, hematochezia. Switched from omeprazole to famotidine 31m BID due to potential for interaction.  --Continue DAPT with ASA and clopidogrel 75 mg daily.  Continue atorvastatin 40 mg daily for secondary prevention. Increase to Coreg 12.574mBID given continued elevated BP. --Outpatient 2 day Lexiscan / stress test scheduled given history of CAD s/p STEMI with PCI as above with risk factors for cardiac etiology. Further recommendations following 2 day stress test results with follow-up 1 month with low risk study, sooner if needed.  Chronic HFpEF --Weight continues to climb with stable SOB/DOE from previous visits. No LEE but some abdominal distention in the setting of recent weight gain. Recent and confirmed continued tobacco cessation, which he suspects contributing to weight gain. Relatively sedentary. Has not needed PRN lasix recently with no reported LEE or ankle edema. Euvolemic on exam.  --Recent 04/2019 labs show stable renal function in the setting of known CKD. Most recent echo EF normal. --Increased BB to Coreg 12.42m92mID for more optimal HR and BP control. With continued elevated BP, could consider addition of losartan at that time. Continue PRN furosemide for LEE.   Hypertension --Poorly controlled BP with noted facial flushing, subjective racing HR. Also noted increasing fatigue and CP but not always at the same time as elevated BP. Increased Coreg to  6.242m42mD last week with some improvement in BP and no further facial flushing. BP in clinic today still sub-optimal with room for further escalation of BB in HR.  --Increase BB to Coreg 12.42mg 23m more optimal HR and BP control. Continue to restrict sodium. As above, with continued elevated BP, could consider future addition of losartan as renal function allows. Monitor BP at home.  HLD --Most recent LDL 47 with goal  <70. Continue statin therapy with atorvastatin 40mg 52my. Recent labs showed normal ALK with alkaline phosphatase elevated.  Per patient, PCP plans to recheck alkaline phosphatase in October 2020.  Further workup per PCP.   Morbid obesity --Increased activity and continued dietary changes recommended.  Arvil Chaco, PA-C 08/09/2019, 2:20 PM

## 2019-08-14 ENCOUNTER — Other Ambulatory Visit: Payer: Self-pay | Admitting: *Deleted

## 2019-08-14 DIAGNOSIS — R079 Chest pain, unspecified: Secondary | ICD-10-CM

## 2019-08-15 ENCOUNTER — Encounter
Admission: RE | Admit: 2019-08-15 | Discharge: 2019-08-15 | Disposition: A | Discharge status: Home / Self Care | Payer: BC Managed Care – PPO | Source: Ambulatory Visit | Attending: Physician Assistant | Admitting: Physician Assistant

## 2019-08-15 ENCOUNTER — Other Ambulatory Visit: Payer: Self-pay

## 2019-08-15 ENCOUNTER — Ambulatory Visit: Payer: BC Managed Care – PPO

## 2019-08-15 DIAGNOSIS — R079 Chest pain, unspecified: Secondary | ICD-10-CM | POA: Insufficient documentation

## 2019-08-15 MED ORDER — TECHNETIUM TC 99M TETROFOSMIN IV KIT
30.0000 | PACK | Freq: Once | INTRAVENOUS | Status: AC | PRN
  Administered 2019-08-15: 10:00:00 31.13 via INTRAVENOUS

## 2019-08-15 MED ORDER — REGADENOSON 0.4 MG/5ML IV SOLN
0.4000 mg | Freq: Once | INTRAVENOUS | Status: AC
  Administered 2019-08-15: 10:00:00 0.4 mg via INTRAVENOUS

## 2019-08-16 ENCOUNTER — Encounter
Admission: RE | Admit: 2019-08-16 | Discharge: 2019-08-16 | Disposition: A | Discharge status: Home / Self Care | Payer: BC Managed Care – PPO | Source: Ambulatory Visit | Attending: Physician Assistant | Admitting: Physician Assistant

## 2019-08-16 LAB — NM MYOCAR MULTI W/SPECT W/WALL MOTION / EF
Estimated workload: 1 METS
LV dias vol: 162 mL (ref 62–150)
LV sys vol: 96 mL
MPHR: 168 {beats}/min
Peak HR: 100 {beats}/min
Percent HR: 59 %
Rest HR: 74 {beats}/min
TID: 1.1

## 2019-08-16 MED ORDER — TECHNETIUM TC 99M TETROFOSMIN IV KIT
30.0000 | PACK | Freq: Once | INTRAVENOUS | Status: AC | PRN
  Administered 2019-08-16: 31.28 via INTRAVENOUS

## 2019-08-17 ENCOUNTER — Telehealth: Payer: Self-pay

## 2019-08-17 NOTE — Telephone Encounter (Signed)
Call to patient to discuss stress test results and POC. Pt verbalized understanding and agreed to call us if any sx occurred inc chest pain or SOB.   Confirmed appt next month.   Advised pt to call for any further questions or concerns.

## 2019-08-17 NOTE — Telephone Encounter (Signed)
-----   Message from Arvil Chaco, PA-C sent at 08/17/2019  1:34 PM EDT ----- Please let [name] Schmidt know that his stress test showed some changes in the movement and pump function of his heart. He had a moderate decrease in his pump function (ability of heart to pump out blood with each squeeze) from that of his 09/2018 echocardiogram study. There was also an area of the heart that was not moving as well as the rest; however, this was ruled consistent with scar tissue and located in an area around where his stents were placed back when he had his STEMI in April 2019. Given that his stress test was somewhat abnormal, further workup with repeat catheterization is recommended for any more chest pain or concerning / progressing symptoms. He has an appointment scheduled with Dr. Saunders Revel for 09/07/19. Please make sure he knows to call the office if he has more chest pain or further concerning/progressing symptoms before that follow-up appointment.

## 2019-08-24 ENCOUNTER — Ambulatory Visit: Payer: BLUE CROSS/BLUE SHIELD | Admitting: Nurse Practitioner

## 2019-09-07 ENCOUNTER — Ambulatory Visit (INDEPENDENT_AMBULATORY_CARE_PROVIDER_SITE_OTHER): Payer: BC Managed Care – PPO | Admitting: Internal Medicine

## 2019-09-07 ENCOUNTER — Encounter: Payer: Self-pay | Admitting: Internal Medicine

## 2019-09-07 ENCOUNTER — Other Ambulatory Visit: Payer: Self-pay

## 2019-09-07 VITALS — BP 112/88 | HR 74 | Ht 71.0 in | Wt 322.5 lb

## 2019-09-07 DIAGNOSIS — I2510 Atherosclerotic heart disease of native coronary artery without angina pectoris: Secondary | ICD-10-CM

## 2019-09-07 DIAGNOSIS — I10 Essential (primary) hypertension: Secondary | ICD-10-CM | POA: Diagnosis not present

## 2019-09-07 DIAGNOSIS — I25118 Atherosclerotic heart disease of native coronary artery with other forms of angina pectoris: Secondary | ICD-10-CM | POA: Diagnosis not present

## 2019-09-07 DIAGNOSIS — E785 Hyperlipidemia, unspecified: Secondary | ICD-10-CM | POA: Diagnosis not present

## 2019-09-07 DIAGNOSIS — I5032 Chronic diastolic (congestive) heart failure: Secondary | ICD-10-CM | POA: Diagnosis not present

## 2019-09-07 DIAGNOSIS — R5382 Chronic fatigue, unspecified: Secondary | ICD-10-CM

## 2019-09-07 MED ORDER — ISOSORBIDE MONONITRATE ER 30 MG PO TB24: 15.0000 mg | ORAL_TABLET | Freq: Every day | ORAL | 3 refills | Status: DC

## 2019-09-07 NOTE — Progress Notes (Signed)
Follow-up Outpatient Visit Date: 09/07/2019  Primary Care Provider: Nonnie Done., MD [address] [facility] [place] [address]  Chief Complaint: Chest pain  HPI:  [name] is a 53 y.o. year-old male with history of coronary artery disease with lateral STEMI on 04/04/18,hypertension,hyperlipidemia, GERD, tobacco use, and morbid obesity, who presents for follow-up of chest pain.  He was most recently seen by Marisue Ivan, PA, in mid August after noticing elevated blood pressure and intermittent chest pain at rest and with exertion.  Carvedilol was increased to 12.5 mg BID.  Subsequent myocardial perfusion stress test showed a fixed mid/apical anterior/anterolateral defect with moderately reduced LVEF.  No ischemia was identified.  Today, [name] reports that he has noticed some improvement in his chest discomfort with escalation of carvedilol, though it still happens most days of the week.  Sometimes it is random, though most often it worsens with activity or stress.  Symptoms seem to have started around the time that the COVID-19 pandemic began, as [name] was unable to keep going to the gym.  He also wonders if switching from ticagrelor to clopidogrel may have contributed.  The discomfort typically resolves after a few minutes without intervention.  He has not needed to use any nitroglycerin.  He has stable exertional dyspnea and denies palpitations, lightheadedness, and significant edema.  He remains on aspirin and clopidogrel without significant bleeding.  [name] notes continued fatigue.  He often does not feel refreshed when he awakens in the morning and could take a nap in the afternoon, if his work allowed.  He has never been evaluated for sleep apnea, though he has been told that he snores and even stops breathing at times.  --------------------------------------------------------------------------------------------------  Past Medical History:  Diagnosis Date  . (HFpEF)  heart failure with preserved ejection fraction (HCC)   . CKD (chronic kidney disease), stage III (HCC)   . Coronary artery disease    a. 03/2018 Lateral STEMI/PCI: LMnl, LAD 20p, 76m, D2 100ost (2.25x12 Resolute Onyx DES), LCX large/nl, OM1/2 large/nl, RCA large, min irregs, RPDA/RPAV large/nl.  . GERD (gastroesophageal reflux disease)   . Hyperlipidemia LDL goal <70   . Hypertension   . Morbid obesity (HCC)   . STEMI (ST elevation myocardial infarction) (HCC)    4/19 PCI/DES x1 to D2.  . Tobacco abuse    Past Surgical History:  Procedure Laterality Date  . CORONARY/GRAFT ACUTE MI REVASCULARIZATION N/A 04/04/2018   Procedure: Coronary/Graft Acute MI Revascularization;  Surgeon: Yvonne Kendall, MD;  Location: [facility];  Service: Cardiovascular;  Laterality: N/A;  . LEFT HEART CATH AND CORONARY ANGIOGRAPHY N/A 04/04/2018   Procedure: LEFT HEART CATH AND CORONARY ANGIOGRAPHY;  Surgeon: Yvonne Kendall, MD;  Location: [facility];  Service: Cardiovascular;  Laterality: N/A;    Current Meds  Medication Sig  . aspirin 81 MG chewable tablet Chew 1 tablet (81 mg total) by mouth daily.  [name] atorvastatin (LIPITOR) 40 MG tablet Take 1 tablet (40 mg total) by mouth daily.  . carvedilol (COREG) 12.5 MG tablet Take 1 tablet (12.5 mg total) by mouth 2 (two) times daily with a meal.  . clopidogrel (PLAVIX) 75 MG tablet Take 1 tablet (75 mg total) by mouth daily.  . famotidine (PEPCID) 20 MG tablet Take 1 tablet (20 mg total) by mouth 2 (two) times daily.  . furosemide (LASIX) 20 MG tablet TAKE 1 TABLET BY MOUTH DAILY AS NEEDED (FOR WEIGHT GAIN AND/OR SWELLING).  . nitroGLYCERIN (NITROSTAT)  0.4 MG SL tablet Place 1 tablet (0.4 mg total) under the tongue every 5 (five) minutes as needed.  . tamsulosin (FLOMAX) 0.4 MG CAPS capsule Take 0.4 mg by mouth daily.    Allergies: Patient has no known allergies.  Social History   Tobacco Use  . Smoking status: Current Every Day Smoker     Packs/day: 0.50    Types: Cigarettes  . Smokeless tobacco: Never Used  Substance Use Topics  . Alcohol use: Not Currently  . Drug use: Never    Family History  Problem Relation Age of Onset  . Cancer Mother     Review of Systems: A 12-system review of systems was performed and was negative except as noted in the HPI.  --------------------------------------------------------------------------------------------------  Physical Exam: BP 112/88 (BP Location: Left Arm, Patient Position: Sitting, Cuff Size: Large)   Pulse 74   Ht 5\' 11"  (1.803 m)   Wt (!) 322 lb 8 oz (146.3 kg)   SpO2 98%   BMI 44.98 kg/m   General: NAD. HEENT: No conjunctival pallor or scleral icterus.  Facemask in place. Neck: Supple without lymphadenopathy, thyromegaly, JVD, or HJR. Lungs: Normal work of breathing. Clear to auscultation bilaterally without wheezes or crackles. Heart: Regular rate and rhythm without murmurs, rubs, or gallops. Non-displaced PMI. Abd: Bowel sounds present. Soft, NT/ND without hepatosplenomegaly Ext: No lower extremity edema. Radial, PT, and DP pulses are 2+ bilaterally. Skin: Warm and dry without rash.  EKG: Normal sinus rhythm with low voltage.  Otherwise, no significant abnormality.  Lab Results  Component Value Date   WBC 5.9 09/07/2019   HGB 16.1 09/07/2019   HCT 49.3 09/07/2019   MCV 88 09/07/2019   PLT 134 (L) 09/07/2019    Lab Results  Component Value Date   NA 143 09/07/2019   K 4.0 09/07/2019   CL 109 (H) 09/07/2019   CO2 20 09/07/2019   BUN 28 (H) 09/07/2019   CREATININE 1.11 09/07/2019   GLUCOSE 106 (H) 09/07/2019   ALT 32 06/30/2018    Lab Results  Component Value Date   CHOL 87 06/30/2018   HDL 42 06/30/2018   LDLCALC 35 06/30/2018   TRIG 51 06/30/2018   CHOLHDL 2.1 06/30/2018    --------------------------------------------------------------------------------------------------  ASSESSMENT AND PLAN: Stable angina: [name] notes mild  improvement in chest pain with escalation of carvedilol, though he still has chest discomfort most days.  At times, it will come on randomly, including at rest.  However, he also feels like exertion often brings it on.  He has not tried using sublingual nitroglycerin for it, as it typically abates on its own.  Recent myocardial perfusion stress test was intermediate risk with evidence of prior anterior/anterolateral scar and moderately reduced LVEF.  We have discussed escalation of medical therapy versus catheterization.  [name] wishes to proceed with catheterization.  I have reviewed the risks, indications, and alternatives to cardiac catheterization, possible angioplasty, and stenting with the patient. Risks include but are not limited to bleeding, infection, vascular injury, stroke, myocardial infection, arrhythmia, kidney injury, radiation-related injury in the case of prolonged fluoroscopy use, emergency cardiac surgery, and death. The patient understands the risks of serious complication is 1-2 in 1000 with diagnostic cardiac cath and 1-2% or less with angioplasty/stenting.  In the meantime, we will start him on isosorbide mononitrate 15 mg daily.  He should remain on dual antiplatelet therapy with aspirin and clopidogrel as well as high intensity statin therapy.  Hypertension: Blood pressure well controlled today.  As above, we will start low-dose isosorbide mononitrate.  I will not make other medication changes time.  Hyperlipidemia: Continue atorvastatin 40 mg daily; LDL well controlled.  Chronic HFpEF: Mr. Stahnke appears euvolemic without signs or symptoms of heart failure.  We will continue carvedilol and as needed furosemide.  Chronic fatigue with concern for obstructive sleep apnea: Mr. Montesinos reports longstanding fatigue, noting that he does not feel refreshed in the morning and often feels like he needs a nap during the day.  In light of the symptoms, his morbid obesity, and reports of  snoring, we will refer him to pulmonary for sleep evaluation.  Morbid obesity: Weight loss encouraged through diet and exercise, though strenuous activity should be deferred until after upcoming catheterization.  Follow-up: Return to clinic in 1 month.  Nelva Bush, MD 09/09/2019 9:47 AM

## 2019-09-07 NOTE — Patient Instructions (Signed)
Medication Instructions:  1- START Imdur 0.5 tablet (15 mg total) once daily  If you need a refill on your cardiac medications before your next appointment, please call your pharmacy.   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  Following covid testing, please remain in quarantine. If you must be around others, please wash hands, avoid touching face and wear your mask.    If you have labs (blood work) drawn today and your tests are completely normal, you will receive your results only by: [name] MyChart Message (if you have MyChart) OR . A paper copy in the mail If you have any lab test that is abnormal or we need to change your treatment, we will call you to review the results.  Testing/Procedures: 1- L Heart Cath     [address] [place], [place] [address] Dept: [phone] Loc: [place]  09/07/2019  You are scheduled for a Cardiac Catheterization on Thursday, September 24 with Dr. Harrell Gave End.  1. Please arrive at the [facility] (Main Entrance A) at [facility]: [place], [place] [address] at 5:30 AM (This time is two hours before your procedure to ensure your preparation). Free valet parking service is available.   Special note: Every effort is made to have your procedure done on time. Please understand that emergencies sometimes delay scheduled procedures.  2. Diet: Do not eat solid foods after midnight.  The patient may have clear liquids until 5am upon the day of the procedure.  3. Labs: You will need to have blood drawn today. 4. Medication instructions in preparation  for your procedure:  On the morning of your procedure, take your Plavix/Clopidogrel and any morning medicines NOT listed above.  You may use sips of water.  5. Plan for one night stay--bring personal belongings. 6. Bring a current list of your medications and current insurance cards. 7. You MUST have a responsible person to drive you home. 8. Someone MUST be with you the first 24 hours after you arrive home or your discharge will be delayed. 9. Please wear clothes that are easy to get on and off and wear slip-on shoes.  Thank you for allowing [place] to care for you!   -- [facility]   Follow-Up: At [facility], you and your health needs are our priority.  As part of our continuing mission to provide you with exceptional heart care, we have created designated Provider Care Teams.  These Care Teams include your primary Cardiologist (physician) and Advanced Practice Providers (APPs -  Physician Assistants and Nurse Practitioners) who all work together to provide you with the care you need, when you need it. You will need a follow up appointment in 1 months.  You may see Nelva Bush, MD or one of the following Advanced Practice Providers on your designated Care Team:   Murray Hodgkins, NP Christell Faith, PA-C . Marrianne Mood, PA-C  Any Other Special Instructions Will Be Listed Below (If Applicable). Ref to Pulmonary for sleep study

## 2019-09-07 NOTE — H&P (View-Only) (Signed)
Follow-up Outpatient Visit Date: 09/07/2019  Primary Care Provider: Nonnie Done., MD [address] [facility] [address]  Chief Complaint: Chest pain  HPI:  [name] is a 53 y.o. year-old male with history of coronary artery disease with lateral STEMI on 04/04/18,hypertension,hyperlipidemia, GERD, tobacco use, and morbid obesity, who presents for follow-up of chest pain.  He was most recently seen by Marisue Ivan, PA, in mid August after noticing elevated blood pressure and intermittent chest pain at rest and with exertion.  Carvedilol was increased to 12.5 mg BID.  Subsequent myocardial perfusion stress test showed a fixed mid/apical anterior/anterolateral defect with moderately reduced LVEF.  No ischemia was identified.  Today, [name] reports that he has noticed some improvement in his chest discomfort with escalation of carvedilol, though it still happens most days of the week.  Sometimes it is random, though most often it worsens with activity or stress.  Symptoms seem to have started around the time that the COVID-19 pandemic began, as [name] was unable to keep going to the gym.  He also wonders if switching from ticagrelor to clopidogrel may have contributed.  The discomfort typically resolves after a few minutes without intervention.  He has not needed to use any nitroglycerin.  He has stable exertional dyspnea and denies palpitations, lightheadedness, and significant edema.  He remains on aspirin and clopidogrel without significant bleeding.  [name] notes continued fatigue.  He often does not feel refreshed when he awakens in the morning and could take a nap in the afternoon, if his work allowed.  He has never been evaluated for sleep apnea, though he has been told that he snores and even stops breathing at times.  --------------------------------------------------------------------------------------------------  Past Medical History:  Diagnosis Date  . (HFpEF)  heart failure with preserved ejection fraction (HCC)   . CKD (chronic kidney disease), stage III (HCC)   . Coronary artery disease    a. 03/2018 Lateral STEMI/PCI: LMnl, LAD 20p, 76m, D2 100ost (2.25x12 Resolute Onyx DES), LCX large/nl, OM1/2 large/nl, RCA large, min irregs, RPDA/RPAV large/nl.  . GERD (gastroesophageal reflux disease)   . Hyperlipidemia LDL goal <70   . Hypertension   . Morbid obesity (HCC)   . STEMI (ST elevation myocardial infarction) (HCC)    4/19 PCI/DES x1 to D2.  . Tobacco abuse    Past Surgical History:  Procedure Laterality Date  . CORONARY/GRAFT ACUTE MI REVASCULARIZATION N/A 04/04/2018   Procedure: Coronary/Graft Acute MI Revascularization;  Surgeon: Yvonne Kendall, MD;  Location: [facility] INVASIVE CV LAB;  Service: Cardiovascular;  Laterality: N/A;  . LEFT HEART CATH AND CORONARY ANGIOGRAPHY N/A 04/04/2018   Procedure: LEFT HEART CATH AND CORONARY ANGIOGRAPHY;  Surgeon: Yvonne Kendall, MD;  Location: [facility] INVASIVE CV LAB;  Service: Cardiovascular;  Laterality: N/A;    Current Meds  Medication Sig  . aspirin 81 MG chewable tablet Chew 1 tablet (81 mg total) by mouth daily.  [name] atorvastatin (LIPITOR) 40 MG tablet Take 1 tablet (40 mg total) by mouth daily.  . carvedilol (COREG) 12.5 MG tablet Take 1 tablet (12.5 mg total) by mouth 2 (two) times daily with a meal.  . clopidogrel (PLAVIX) 75 MG tablet Take 1 tablet (75 mg total) by mouth daily.  . famotidine (PEPCID) 20 MG tablet Take 1 tablet (20 mg total) by mouth 2 (two) times daily.  . furosemide (LASIX) 20 MG tablet TAKE 1 TABLET BY MOUTH DAILY AS NEEDED (FOR WEIGHT GAIN AND/OR SWELLING).  . nitroGLYCERIN (NITROSTAT)  0.4 MG SL tablet Place 1 tablet (0.4 mg total) under the tongue every 5 (five) minutes as needed.  . tamsulosin (FLOMAX) 0.4 MG CAPS capsule Take 0.4 mg by mouth daily.    Allergies: Patient has no known allergies.  Social History   Tobacco Use  . Smoking status: Current Every Day Smoker     Packs/day: 0.50    Types: Cigarettes  . Smokeless tobacco: Never Used  Substance Use Topics  . Alcohol use: Not Currently  . Drug use: Never    Family History  Problem Relation Age of Onset  . Cancer Mother     Review of Systems: A 12-system review of systems was performed and was negative except as noted in the HPI.  --------------------------------------------------------------------------------------------------  Physical Exam: BP 112/88 (BP Location: Left Arm, Patient Position: Sitting, Cuff Size: Large)   Pulse 74   Ht 5' 11" (1.803 m)   Wt (!) 322 lb 8 oz (146.3 kg)   SpO2 98%   BMI 44.98 kg/m   General: NAD. HEENT: No conjunctival pallor or scleral icterus.  Facemask in place. Neck: Supple without lymphadenopathy, thyromegaly, JVD, or HJR. Lungs: Normal work of breathing. Clear to auscultation bilaterally without wheezes or crackles. Heart: Regular rate and rhythm without murmurs, rubs, or gallops. Non-displaced PMI. Abd: Bowel sounds present. Soft, NT/ND without hepatosplenomegaly Ext: No lower extremity edema. Radial, PT, and DP pulses are 2+ bilaterally. Skin: Warm and dry without rash.  EKG: Normal sinus rhythm with low voltage.  Otherwise, no significant abnormality.  Lab Results  Component Value Date   WBC 5.9 09/07/2019   HGB 16.1 09/07/2019   HCT 49.3 09/07/2019   MCV 88 09/07/2019   PLT 134 (L) 09/07/2019    Lab Results  Component Value Date   NA 143 09/07/2019   K 4.0 09/07/2019   CL 109 (H) 09/07/2019   CO2 20 09/07/2019   BUN 28 (H) 09/07/2019   CREATININE 1.11 09/07/2019   GLUCOSE 106 (H) 09/07/2019   ALT 32 06/30/2018    Lab Results  Component Value Date   CHOL 87 06/30/2018   HDL 42 06/30/2018   LDLCALC 35 06/30/2018   TRIG 51 06/30/2018   CHOLHDL 2.1 06/30/2018    --------------------------------------------------------------------------------------------------  ASSESSMENT AND PLAN: Stable angina: [name] notes mild  improvement in chest pain with escalation of carvedilol, though he still has chest discomfort most days.  At times, it will come on randomly, including at rest.  However, he also feels like exertion often brings it on.  He has not tried using sublingual nitroglycerin for it, as it typically abates on its own.  Recent myocardial perfusion stress test was intermediate risk with evidence of prior anterior/anterolateral scar and moderately reduced LVEF.  We have discussed escalation of medical therapy versus catheterization.  [name] wishes to proceed with catheterization.  I have reviewed the risks, indications, and alternatives to cardiac catheterization, possible angioplasty, and stenting with the patient. Risks include but are not limited to bleeding, infection, vascular injury, stroke, myocardial infection, arrhythmia, kidney injury, radiation-related injury in the case of prolonged fluoroscopy use, emergency cardiac surgery, and death. The patient understands the risks of serious complication is 1-2 in 1000 with diagnostic cardiac cath and 1-2% or less with angioplasty/stenting.  In the meantime, we will start him on isosorbide mononitrate 15 mg daily.  He should remain on dual antiplatelet therapy with aspirin and clopidogrel as well as high intensity statin therapy.  Hypertension: Blood pressure well controlled today.    As above, we will start low-dose isosorbide mononitrate.  I will not make other medication changes time.  Hyperlipidemia: Continue atorvastatin 40 mg daily; LDL well controlled.  Chronic HFpEF: [name] appears euvolemic without signs or symptoms of heart failure.  We will continue carvedilol and as needed furosemide.  Chronic fatigue with concern for obstructive sleep apnea: [name] reports longstanding fatigue, noting that he does not feel refreshed in the morning and often feels like he needs a nap during the day.  In light of the symptoms, his morbid obesity, and reports of  snoring, we will refer him to pulmonary for sleep evaluation.  Morbid obesity: Weight loss encouraged through diet and exercise, though strenuous activity should be deferred until after upcoming catheterization.  Follow-up: Return to clinic in 1 month.  Nelva Bush, MD 09/09/2019 9:47 AM

## 2019-09-08 ENCOUNTER — Telehealth: Payer: Self-pay

## 2019-09-08 LAB — BASIC METABOLIC PANEL
BUN/Creatinine Ratio: 25 — ABNORMAL HIGH (ref 9–20)
BUN: 28 mg/dL — ABNORMAL HIGH (ref 6–24)
CO2: 20 mmol/L (ref 20–29)
Calcium: 9.2 mg/dL (ref 8.7–10.2)
Chloride: 109 mmol/L — ABNORMAL HIGH (ref 96–106)
Creatinine, Ser: 1.11 mg/dL (ref 0.76–1.27)
GFR calc Af Amer: 88 mL/min/{1.73_m2} (ref >59)
GFR calc non Af Amer: 76 mL/min/{1.73_m2} (ref >59)
Glucose: 106 mg/dL — ABNORMAL HIGH (ref 65–99)
Potassium: 4 mmol/L (ref 3.5–5.2)
Sodium: 143 mmol/L (ref 134–144)

## 2019-09-08 LAB — CBC
Hematocrit: 49.3 % (ref 37.5–51.0)
Hemoglobin: 16.1 g/dL (ref 13.0–17.7)
MCH: 28.6 pg (ref 26.6–33.0)
MCHC: 32.7 g/dL (ref 31.5–35.7)
MCV: 88 fL (ref 79–97)
Platelets: 134 10*3/uL — ABNORMAL LOW (ref 150–450)
RBC: 5.63 x10E6/uL (ref 4.14–5.80)
RDW: 14.7 % (ref 11.6–15.4)
WBC: 5.9 10*3/uL (ref 3.4–10.8)

## 2019-09-08 NOTE — Telephone Encounter (Signed)
-----   Message from Nelva Bush, MD sent at 09/08/2019  7:19 AM EDT ----- Please [name] know that his kidney function and electrolytes are stable.  He has mild thrombocytopenia, which is nonspecific and has been seen at times in the past.  We will proceed with cardiac catheterization, as planned.  He should continue his medications as discussed at yesterday's visit.

## 2019-09-08 NOTE — Telephone Encounter (Signed)
Call to patient to discuss labs. No further questions or orders at this time.   Advised pt to call for any further questions or concerns.

## 2019-09-09 ENCOUNTER — Encounter: Payer: Self-pay | Admitting: Internal Medicine

## 2019-09-11 ENCOUNTER — Encounter
Admission: RE | Admit: 2019-09-11 | Discharge: 2019-09-11 | Disposition: A | Discharge status: Home / Self Care | Payer: BC Managed Care – PPO | Source: Ambulatory Visit | Attending: Internal Medicine | Admitting: Internal Medicine

## 2019-09-11 ENCOUNTER — Other Ambulatory Visit: Payer: Self-pay

## 2019-09-11 DIAGNOSIS — Z01812 Encounter for preprocedural laboratory examination: Secondary | ICD-10-CM | POA: Diagnosis not present

## 2019-09-11 DIAGNOSIS — Z20828 Contact with and (suspected) exposure to other viral communicable diseases: Secondary | ICD-10-CM | POA: Diagnosis not present

## 2019-09-12 ENCOUNTER — Telehealth: Payer: Self-pay | Admitting: *Deleted

## 2019-09-12 ENCOUNTER — Telehealth: Payer: Self-pay

## 2019-09-12 LAB — SARS CORONAVIRUS 2 (TAT 6-24 HRS): SARS Coronavirus 2: NEGATIVE

## 2019-09-12 NOTE — Telephone Encounter (Signed)
Call to patient to make him aware that covid test was neg and cath is on as scheduled.   Advised pt to call for any further questions or concerns.

## 2019-09-12 NOTE — Telephone Encounter (Signed)
-----   Message from Nelva Bush, MD sent at 09/12/2019  6:37 AM EDT ----- COVID-19 negative.  Ok to proceed with catheterization as planned.

## 2019-09-12 NOTE — Telephone Encounter (Signed)
Pt contacted pre-catheterization scheduled at [facility] for: Thursday September 14, 2019 7:30 AM Verified arrival time and place: [facility]) at: 5:30 AM   No solid food after midnight prior to cath, clear liquids until 5 AM day of procedure. Contrast allergy: no  Hold: Lasix-AM of procedure.  Except hold medications AM meds can be  taken pre-cath with sip of water including: ASA 81 mg Plavix 75 mg   Confirmed patient has responsible person to drive home post procedure and observe 24 hours after arriving home: yes  Currently, due to Covid-19 pandemic, only one support person will be allowed with patient. Must be the same support person for that patient's entire stay, will be screened and required to wear a mask. They will be asked to wait in the waiting room for the duration of the patient's stay.  Patients are required to wear a mask when they enter the hospital.      COVID-19 Pre-Screening Questions:  . In the past 7 to 10 days have you had a cough,  shortness of breath, headache, congestion, fever (100 or greater) body aches, chills, sore throat, or sudden loss of taste or sense of smell? Shortness of breath . Have you been around anyone with known Covid 19? no . Have you been around anyone who is awaiting Covid 19 test results in the past 7 to 10 days? no . Have you been around anyone who has been exposed to Covid 19, or has mentioned symptoms of Covid 19 within the past 7 to 10 days? no  I reviewed procedure/mask/visitor instructions, Covid-19 screening questions with patient, he verbalized understanding, thanked me for call.

## 2019-09-14 ENCOUNTER — Encounter (HOSPITAL_COMMUNITY)
Admission: RE | Disposition: A | Discharge status: Home / Self Care | Payer: BC Managed Care – PPO | Source: Home / Self Care | Attending: Internal Medicine

## 2019-09-14 ENCOUNTER — Ambulatory Visit (HOSPITAL_COMMUNITY)
Admission: RE | Admit: 2019-09-14 | Discharge: 2019-09-14 | Disposition: A | Discharge status: Home / Self Care | Payer: BC Managed Care – PPO | Attending: Internal Medicine | Admitting: Internal Medicine

## 2019-09-14 ENCOUNTER — Other Ambulatory Visit: Payer: Self-pay

## 2019-09-14 ENCOUNTER — Telehealth: Payer: Self-pay | Admitting: Internal Medicine

## 2019-09-14 ENCOUNTER — Encounter (HOSPITAL_COMMUNITY): Payer: Self-pay | Admitting: Internal Medicine

## 2019-09-14 DIAGNOSIS — I252 Old myocardial infarction: Secondary | ICD-10-CM | POA: Insufficient documentation

## 2019-09-14 DIAGNOSIS — Z79899 Other long term (current) drug therapy: Secondary | ICD-10-CM | POA: Insufficient documentation

## 2019-09-14 DIAGNOSIS — F17210 Nicotine dependence, cigarettes, uncomplicated: Secondary | ICD-10-CM | POA: Insufficient documentation

## 2019-09-14 DIAGNOSIS — R5382 Chronic fatigue, unspecified: Secondary | ICD-10-CM | POA: Diagnosis not present

## 2019-09-14 DIAGNOSIS — K219 Gastro-esophageal reflux disease without esophagitis: Secondary | ICD-10-CM | POA: Diagnosis not present

## 2019-09-14 DIAGNOSIS — Z7982 Long term (current) use of aspirin: Secondary | ICD-10-CM | POA: Insufficient documentation

## 2019-09-14 DIAGNOSIS — I5032 Chronic diastolic (congestive) heart failure: Secondary | ICD-10-CM | POA: Insufficient documentation

## 2019-09-14 DIAGNOSIS — I25118 Atherosclerotic heart disease of native coronary artery with other forms of angina pectoris: Secondary | ICD-10-CM | POA: Diagnosis present

## 2019-09-14 DIAGNOSIS — Z7902 Long term (current) use of antithrombotics/antiplatelets: Secondary | ICD-10-CM | POA: Insufficient documentation

## 2019-09-14 DIAGNOSIS — Z6841 Body Mass Index (BMI) 40.0 and over, adult: Secondary | ICD-10-CM | POA: Diagnosis not present

## 2019-09-14 DIAGNOSIS — Z955 Presence of coronary angioplasty implant and graft: Secondary | ICD-10-CM | POA: Diagnosis not present

## 2019-09-14 DIAGNOSIS — N183 Chronic kidney disease, stage 3 (moderate): Secondary | ICD-10-CM | POA: Insufficient documentation

## 2019-09-14 DIAGNOSIS — E785 Hyperlipidemia, unspecified: Secondary | ICD-10-CM | POA: Insufficient documentation

## 2019-09-14 DIAGNOSIS — I208 Other forms of angina pectoris: Secondary | ICD-10-CM | POA: Diagnosis present

## 2019-09-14 DIAGNOSIS — I130 Hypertensive heart and chronic kidney disease with heart failure and stage 1 through stage 4 chronic kidney disease, or unspecified chronic kidney disease: Secondary | ICD-10-CM | POA: Diagnosis not present

## 2019-09-14 DIAGNOSIS — R0683 Snoring: Secondary | ICD-10-CM | POA: Diagnosis not present

## 2019-09-14 HISTORY — PX: INTRAVASCULAR PRESSURE WIRE/FFR STUDY: CATH118243

## 2019-09-14 HISTORY — PX: LEFT HEART CATH AND CORONARY ANGIOGRAPHY: CATH118249

## 2019-09-14 LAB — POCT ACTIVATED CLOTTING TIME: Activated Clotting Time: 274 seconds

## 2019-09-14 SURGERY — LEFT HEART CATH AND CORONARY ANGIOGRAPHY
Anesthesia: LOCAL

## 2019-09-14 MED ORDER — HEPARIN SODIUM (PORCINE) 1000 UNIT/ML IJ SOLN
INTRAMUSCULAR | Status: AC
  Filled 2019-09-14: qty 1

## 2019-09-14 MED ORDER — FENTANYL CITRATE (PF) 100 MCG/2ML IJ SOLN
INTRAMUSCULAR | Status: DC | PRN
  Administered 2019-09-14: 50 ug via INTRAVENOUS

## 2019-09-14 MED ORDER — LABETALOL HCL 5 MG/ML IV SOLN: 10.0000 mg | INTRAVENOUS | Status: DC | PRN

## 2019-09-14 MED ORDER — CLOPIDOGREL BISULFATE 75 MG PO TABS: 75.0000 mg | ORAL_TABLET | Freq: Every day | ORAL | Status: DC

## 2019-09-14 MED ORDER — LIDOCAINE HCL (PF) 1 % IJ SOLN
INTRAMUSCULAR | Status: DC | PRN
  Administered 2019-09-14: 2 mL

## 2019-09-14 MED ORDER — HEPARIN SODIUM (PORCINE) 1000 UNIT/ML IJ SOLN
INTRAMUSCULAR | Status: DC | PRN
  Administered 2019-09-14: 7000 [IU] via INTRAVENOUS
  Administered 2019-09-14: 5000 [IU] via INTRAVENOUS

## 2019-09-14 MED ORDER — SODIUM CHLORIDE 0.9% FLUSH: 3.0000 mL | Freq: Two times a day (BID) | INTRAVENOUS | Status: DC

## 2019-09-14 MED ORDER — SODIUM CHLORIDE 0.9 % WEIGHT BASED INFUSION: 1.0000 mL/kg/h | INTRAVENOUS | Status: DC

## 2019-09-14 MED ORDER — IOHEXOL 350 MG/ML SOLN
INTRAVENOUS | Status: DC | PRN
  Administered 2019-09-14: 105 mL via INTRA_ARTERIAL

## 2019-09-14 MED ORDER — FENTANYL CITRATE (PF) 100 MCG/2ML IJ SOLN
INTRAMUSCULAR | Status: AC
  Filled 2019-09-14: qty 2

## 2019-09-14 MED ORDER — ASPIRIN 81 MG PO CHEW: 81.0000 mg | CHEWABLE_TABLET | ORAL | Status: DC

## 2019-09-14 MED ORDER — ACETAMINOPHEN 325 MG PO TABS: 650.0000 mg | ORAL_TABLET | ORAL | Status: DC | PRN

## 2019-09-14 MED ORDER — NITROGLYCERIN 1 MG/10 ML FOR IR/CATH LAB
INTRA_ARTERIAL | Status: AC
  Filled 2019-09-14: qty 10

## 2019-09-14 MED ORDER — VERAPAMIL HCL 2.5 MG/ML IV SOLN
INTRAVENOUS | Status: DC | PRN
  Administered 2019-09-14: 08:00:00 10 mL via INTRA_ARTERIAL

## 2019-09-14 MED ORDER — VERAPAMIL HCL 2.5 MG/ML IV SOLN
INTRAVENOUS | Status: AC
  Filled 2019-09-14: qty 2

## 2019-09-14 MED ORDER — SODIUM CHLORIDE 0.9 % IV SOLN: 250.0000 mL | INTRAVENOUS | Status: DC | PRN

## 2019-09-14 MED ORDER — MIDAZOLAM HCL 2 MG/2ML IJ SOLN
INTRAMUSCULAR | Status: DC | PRN
  Administered 2019-09-14: 1 mg via INTRAVENOUS

## 2019-09-14 MED ORDER — MIDAZOLAM HCL 2 MG/2ML IJ SOLN
INTRAMUSCULAR | Status: AC
  Filled 2019-09-14: qty 2

## 2019-09-14 MED ORDER — SODIUM CHLORIDE 0.9% FLUSH: 3.0000 mL | INTRAVENOUS | Status: DC | PRN

## 2019-09-14 MED ORDER — SODIUM CHLORIDE 0.9 % WEIGHT BASED INFUSION
3.0000 mL/kg/h | INTRAVENOUS | Status: AC
  Administered 2019-09-14: 07:00:00 3 mL/kg/h via INTRAVENOUS

## 2019-09-14 MED ORDER — ASPIRIN 81 MG PO CHEW: 81.0000 mg | CHEWABLE_TABLET | Freq: Every day | ORAL | Status: DC

## 2019-09-14 MED ORDER — NITROGLYCERIN 1 MG/10 ML FOR IR/CATH LAB
INTRA_ARTERIAL | Status: DC | PRN
  Administered 2019-09-14 (×2): 100 ug via INTRACORONARY

## 2019-09-14 MED ORDER — LIDOCAINE HCL (PF) 1 % IJ SOLN
INTRAMUSCULAR | Status: AC
  Filled 2019-09-14: qty 30

## 2019-09-14 MED ORDER — HYDRALAZINE HCL 20 MG/ML IJ SOLN: 10.0000 mg | INTRAMUSCULAR | Status: DC | PRN

## 2019-09-14 MED ORDER — SODIUM CHLORIDE 0.9 % IV SOLN: INTRAVENOUS | Status: DC

## 2019-09-14 MED ORDER — HEPARIN (PORCINE) IN NACL 1000-0.9 UT/500ML-% IV SOLN
INTRAVENOUS | Status: AC
  Filled 2019-09-14: qty 1000

## 2019-09-14 MED ORDER — ONDANSETRON HCL 4 MG/2ML IJ SOLN: 4.0000 mg | Freq: Four times a day (QID) | INTRAMUSCULAR | Status: DC | PRN

## 2019-09-14 MED ORDER — HEPARIN (PORCINE) IN NACL 1000-0.9 UT/500ML-% IV SOLN
INTRAVENOUS | Status: DC | PRN
  Administered 2019-09-14 (×2): 500 mL

## 2019-09-14 SURGICAL SUPPLY — 13 items
CATH 5FR JL3.5 JR4 ANG PIG MP (CATHETERS) ×1 IMPLANT
CATH VISTA GUIDE 6FR XBLAD3.5 (CATHETERS) ×1 IMPLANT
DEVICE RAD COMP TR BAND LRG (VASCULAR PRODUCTS) ×1 IMPLANT
GLIDESHEATH SLEND SS 6F .021 (SHEATH) ×1 IMPLANT
GUIDEWIRE INQWIRE 1.5J.035X260 (WIRE) IMPLANT
GUIDEWIRE PRESSURE COMET II (WIRE) ×1 IMPLANT
INQWIRE 1.5J .035X260CM (WIRE) ×2
KIT ESSENTIALS PG (KITS) ×1 IMPLANT
KIT HEART LEFT (KITS) ×2 IMPLANT
PACK CARDIAC CATHETERIZATION (CUSTOM PROCEDURE TRAY) ×2 IMPLANT
SYR MEDRAD MARK 7 150ML (SYRINGE) ×2 IMPLANT
TRANSDUCER W/STOPCOCK (MISCELLANEOUS) ×2 IMPLANT
TUBING CIL FLEX 10 FLL-RA (TUBING) ×2 IMPLANT

## 2019-09-14 NOTE — Telephone Encounter (Signed)
-----   Message from Nelva Bush, MD sent at 09/14/2019  8:28 AM EDT ----- Regarding: Move up f/u visit Top of the morning,  Would it be possible to move up [name]' follow-up visit with me or an APP to ~2 weeks from now to reassess his symptoms following today's cath?  Thanks.  [name]

## 2019-09-14 NOTE — Discharge Instructions (Signed)
Drink plenty of fluids °Keep right arm at or above heart level  °Radial Site Care ° °This sheet gives you information about how to care for yourself after your procedure. Your health care provider may also give you more specific instructions. If you have problems or questions, contact your health care provider. °What can I expect after the procedure? °After the procedure, it is common to have: °· Bruising and tenderness at the catheter insertion area. °Follow these instructions at home: °Medicines °· Take over-the-counter and prescription medicines only as told by your health care provider. °Insertion site care °· Follow instructions from your health care provider about how to take care of your insertion site. Make sure you: °? Wash your hands with soap and water before you change your bandage (dressing). If soap and water are not available, use hand sanitizer. °? Change your dressing as told by your health care provider. °? Leave stitches (sutures), skin glue, or adhesive strips in place. These skin closures may need to stay in place for 2 weeks or longer. If adhesive strip edges start to loosen and curl up, you may trim the loose edges. Do not remove adhesive strips completely unless your health care provider tells you to do that. °· Check your insertion site every day for signs of infection. Check for: °? Redness, swelling, or pain. °? Fluid or blood. °? Pus or a bad smell. °? Warmth. °· Do not take baths, swim, or use a hot tub until your health care provider approves. °· You may shower 24-48 hours after the procedure, or as directed by your health care provider. °? Remove the dressing and gently wash the site with plain soap and water. °? Pat the area dry with a clean towel. °? Do not rub the site. That could cause bleeding. °· Do not apply powder or lotion to the site. °Activity ° °· For 24 hours after the procedure, or as directed by your health care provider: °? Do not flex or bend the affected arm. °? Do  not push or pull heavy objects with the affected arm. °? Do not drive yourself home from the hospital or clinic. You may drive 24 hours after the procedure unless your health care provider tells you not to. °? Do not operate machinery or power tools. °· Do not lift anything that is heavier than 10 lb (4.5 kg), or the limit that you are told, until your health care provider says that it is safe. °· Ask your health care provider when it is okay to: °? Return to work or school. °? Resume usual physical activities or sports. °? Resume sexual activity. °General instructions °· If the catheter site starts to bleed, raise your arm and put firm pressure on the site. If the bleeding does not stop, get help right away. This is a medical emergency. °· If you went home on the same day as your procedure, a responsible adult should be with you for the first 24 hours after you arrive home. °· Keep all follow-up visits as told by your health care provider. This is important. °Contact a health care provider if: °· You have a fever. °· You have redness, swelling, or yellow drainage around your insertion site. °Get help right away if: °· You have unusual pain at the radial site. °· The catheter insertion area swells very fast. °· The insertion area is bleeding, and the bleeding does not stop when you hold steady pressure on the area. °· Your arm or hand   becomes pale, cool, tingly, or numb. °These symptoms may represent a serious problem that is an emergency. Do not wait to see if the symptoms will go away. Get medical help right away. Call your local emergency services (911 in the [place] Do not drive yourself to the hospital. °Summary °· After the procedure, it is common to have bruising and tenderness at the site. °· Follow instructions from your health care provider about how to take care of your radial site wound. Check the wound every day for signs of infection. °· Do not lift anything that is heavier than 10 lb (4.5 kg), or the  limit that you are told, until your health care provider says that it is safe. °This information is not intended to replace advice given to you by your health care provider. Make sure you discuss any questions you have with your health care provider. °Document Released: 01/09/2011 Document Revised: 01/12/2018 Document Reviewed: 01/12/2018 °Elsevier Patient Education © 2020 [employer] ° °

## 2019-09-14 NOTE — Interval H&P Note (Signed)
History and Physical Interval Note:  09/14/2019 7:00 AM  [name] Schmidt  has presented today for cardiac catheterization, with the diagnosis of stable angina.  The various methods of treatment have been discussed with the patient and family. After consideration of risks, benefits and other options for treatment, the patient has consented to  Procedure(s): LEFT HEART CATH AND CORONARY ANGIOGRAPHY (N/A) as a surgical intervention.  The patient's history has been reviewed, patient examined, no change in status, stable for surgery.  I have reviewed the patient's chart and labs.  Questions were answered to the patient's satisfaction.    Cath Lab Visit (complete for each Cath Lab visit)  Clinical Evaluation Leading to the Procedure:   ACS: No.  Non-ACS:    Anginal Classification: CCS IV  Anti-ischemic medical therapy: Maximal Therapy (2 or more classes of medications)  Non-Invasive Test Results: Intermediate-risk stress test findings: cardiac mortality 1-3%/year  Prior CABG: No previous CABG  [name] Schmidt

## 2019-09-14 NOTE — Progress Notes (Signed)
Discharge instructions reviewed with pt and Doris (via telephone) both voice understanding.

## 2019-09-14 NOTE — Telephone Encounter (Signed)
Scheduler attempted to call patient 3x with unavailable phone number. Needs appt moved up 2 weeks

## 2019-09-14 NOTE — Brief Op Note (Signed)
BRIEF CARDIAC CATHETERIZATION NOTE  09/14/2019  8:27 AM  PATIENT:  [name] Schmidt  53 y.o. male  PRE-OPERATIVE DIAGNOSIS:  Stable angina  POST-OPERATIVE DIAGNOSIS:  Same  PROCEDURE:  Procedure(s): LEFT HEART CATH AND CORONARY ANGIOGRAPHY (N/A) INTRAVASCULAR PRESSURE WIRE/FFR STUDY (N/A)  SURGEON:  Surgeon(s) and Role:    * Johneric Mcfadden, MD - Primary  FINDINGS: 1. Mild to moderate, non-obstructive CAD. 2. Patent D2 stent. 3. Moderately reduced LVEF.  RECOMMENDATIONS: 1. Medical therapy and secondary prevention.  Nelva Bush, MD [facility] Pager: [phone]

## 2019-09-16 ENCOUNTER — Other Ambulatory Visit: Payer: Self-pay | Admitting: Internal Medicine

## 2019-09-19 NOTE — Telephone Encounter (Signed)
Called and r/s patient to come in on 10/14 at 11 am.

## 2019-09-26 ENCOUNTER — Other Ambulatory Visit: Payer: Self-pay | Admitting: Internal Medicine

## 2019-10-02 NOTE — Progress Notes (Signed)
Follow-up Outpatient Visit Date: 10/04/2019  Primary Care Provider: Nonnie Done., MD [address] [facility] [address]  Chief Complaint: Follow-up chest pain and shortness of breath  HPI:  [name] Schmidt is a 53 y.o. year-old male with history of coronary artery disease with lateral STEMI on 04/04/18,hypertension,hyperlipidemia, GERD, tobacco use, and morbid obesity, who presents for follow-up of chest pain and coronary artery disease.  I last saw [name] Schmidt on 09/07/2019, at which time he reported some improvement in his chest pain with escalation of carvedilol, though continued on a frequent basis.  We agreed to proceed with cardiac catheterization the following week, which showed mild to moderate nonobstructive disease.  There was a 60% mid LAD stenosis that was not quite hemodynamically significant (DFR 0.90).  D2 stent was widely patent with 40% ostial stenosis just before the stent.  Left ventricular systolic function was mildly to moderately reduced with an ejection fraction of 45% and global hypokinesis.  Today, [name] Schmidt reports that his previous exertional and rest chest pain have resolved with isosorbide mononitrate.  He had a headache for the first 2 weeks, though this has now resolved.  Exertional dyspnea is unchanged.  He denies shortness of breath at rest.  He still feels tired when he wakes up in the mornings and does not have much energy during the day.  He denies palpitations, lightheadedness, and edema.  His right radial catheterization site has healed.  He is back to work.  [name] Schmidt has not heard from the pulmonology office to arrange for sleep evaluation.  --------------------------------------------------------------------------------------------------  Past Medical History:  Diagnosis Date   (HFpEF) heart failure with preserved ejection fraction (HCC)    CKD (chronic kidney disease), stage III    Coronary artery disease    a. 03/2018 Lateral STEMI/PCI: LMnl, LAD  20p, 56m, D2 100ost (2.25x12 Resolute Onyx DES), LCX large/nl, OM1/2 large/nl, RCA large, min irregs, RPDA/RPAV large/nl.   GERD (gastroesophageal reflux disease)    Hyperlipidemia LDL goal <70    Hypertension    Morbid obesity (HCC)    STEMI (ST elevation myocardial infarction) (HCC)    4/19 PCI/DES x1 to D2.   Tobacco abuse    Past Surgical History:  Procedure Laterality Date   CORONARY/GRAFT ACUTE MI REVASCULARIZATION N/A 04/04/2018   Procedure: Coronary/Graft Acute MI Revascularization;  Surgeon: Yvonne Kendall, MD;  Location: [facility];  Service: Cardiovascular;  Laterality: N/A;   INTRAVASCULAR PRESSURE WIRE/FFR STUDY N/A 09/14/2019   Procedure: INTRAVASCULAR PRESSURE WIRE/FFR STUDY;  Surgeon: Yvonne Kendall, MD;  Location: [facility];  Service: Cardiovascular;  Laterality: N/A;   LEFT HEART CATH AND CORONARY ANGIOGRAPHY N/A 04/04/2018   Procedure: LEFT HEART CATH AND CORONARY ANGIOGRAPHY;  Surgeon: Yvonne Kendall, MD;  Location: [facility];  Service: Cardiovascular;  Laterality: N/A;   LEFT HEART CATH AND CORONARY ANGIOGRAPHY N/A 09/14/2019   Procedure: LEFT HEART CATH AND CORONARY ANGIOGRAPHY;  Surgeon: Yvonne Kendall, MD;  Location: [facility];  Service: Cardiovascular;  Laterality: N/A;    Current Meds  Medication Sig   aspirin 81 MG chewable tablet Chew 1 tablet (81 mg total) by mouth daily.   atorvastatin (LIPITOR) 40 MG tablet TAKE 1 TABLET BY MOUTH EVERY DAY   calcium carbonate (TUMS - DOSED IN MG ELEMENTAL CALCIUM) 500 MG chewable tablet Chew 1 tablet by mouth daily as needed for indigestion or heartburn.   carvedilol (COREG) 12.5 MG tablet Take 1 tablet (12.5 mg total) by  mouth 2 (two) times daily with a meal.   clopidogrel (PLAVIX) 75 MG tablet Take 1 tablet (75 mg total) by mouth daily.   furosemide (LASIX) 20 MG tablet TAKE 1 TABLET BY MOUTH DAILY AS NEEDED (FOR WEIGHT GAIN AND/OR SWELLING). (Patient taking  differently: Take 20 mg by mouth daily as needed for edema. )   isosorbide mononitrate (IMDUR) 30 MG 24 hr tablet Take 0.5 tablets (15 mg total) by mouth daily.   nitroGLYCERIN (NITROSTAT) 0.4 MG SL tablet Place 1 tablet (0.4 mg total) under the tongue every 5 (five) minutes as needed.   omeprazole (PRILOSEC) 40 MG capsule Take 40 mg by mouth daily.   Phenylephrine-APAP-guaiFENesin (TYLENOL SINUS SEVERE) 5-325-200 MG TABS Take 1 tablet by mouth daily as needed (allergies).   tamsulosin (FLOMAX) 0.4 MG CAPS capsule Take 0.4 mg by mouth every evening.     Allergies: Patient has no known allergies.  Social History   Tobacco Use   Smoking status: Former Smoker    Packs/day: 0.50    Types: Cigarettes    Quit date: 09/04/2019    Years since quitting: 0.0   Smokeless tobacco: Never Used  Substance Use Topics   Alcohol use: Not Currently   Drug use: Never    Family History  Problem Relation Age of Onset   Cancer Mother     Review of Systems: A 12-system review of systems was performed and was negative except as noted in the HPI.  --------------------------------------------------------------------------------------------------  Physical Exam: BP 110/80 (BP Location: Left Arm, Patient Position: Sitting, Cuff Size: Large)    Pulse 77    Temp (!) 97.2 F (36.2 C)    Ht 5\' 11"  (1.803 m)    Wt (!) 326 lb 12 oz (148.2 kg)    BMI 45.57 kg/m   General: NAD. HEENT: No conjunctival pallor or scleral icterus.  Facemask in place. Neck: Supple without lymphadenopathy, thyromegaly, JVD, or HJR. Lungs: Normal work of breathing. Clear to auscultation bilaterally without wheezes or crackles. Heart: Regular rate and rhythm without murmurs, rubs, or gallops.  Unable to assess PMI due to body habitus. Abd: Bowel sounds present. Soft, NT/ND. Ext: No lower extremity edema.  Right radial arteriotomy site well-healed with 2+ pulse. Skin: Warm and dry without rash.  EKG: Normal sinus rhythm  with low voltage.  Otherwise, no significant abnormality.  Lab Results  Component Value Date   WBC 5.9 09/07/2019   HGB 16.1 09/07/2019   HCT 49.3 09/07/2019   MCV 88 09/07/2019   PLT 134 (L) 09/07/2019    Lab Results  Component Value Date   NA 143 09/07/2019   K 4.0 09/07/2019   CL 109 (H) 09/07/2019   CO2 20 09/07/2019   BUN 28 (H) 09/07/2019   CREATININE 1.11 09/07/2019   GLUCOSE 106 (H) 09/07/2019   ALT 32 06/30/2018    Lab Results  Component Value Date   CHOL 87 06/30/2018   HDL 42 06/30/2018   LDLCALC 35 06/30/2018   TRIG 51 06/30/2018   CHOLHDL 2.1 06/30/2018    --------------------------------------------------------------------------------------------------  ASSESSMENT AND PLAN: Coronary artery disease with stable angina: Chest pain has resolved with routine use of isosorbide mononitrate 15 mg daily.  We will continue this and carvedilol 12.5 mg twice daily.  Recent cardiac catheterization showed patent diagonal stent and moderate LAD disease that was not hemodynamically significant (iFR 0.90).  We will continue current medications and focus on optimization of heart failure and suspected sleep apnea.  Continue indefinite dual  antiplatelet therapy with aspirin and clopidogrel as well as high intensity statin therapy.  Non-ischemic cardiomyopathy and chronic HFpEF: LVEF noted to be mildly to moderately reduced on left ventriculogram with global hypokinesis (EF 45%), out of proportion to CAD.  We will add lisinopril 2.5 mg daily with a basic metabolic panel in 2 weeks.  Continue carvedilol 12.5 mg twice daily.  Fatigue and dyspnea on exertion: Morbid obesity and likely obstructive sleep apnea are probably the driving forces.  We will refer Mr. Alwin to our sleep medicine providers in [place] for further evaluation.  Weight loss through diet and exercise were encouraged.  Follow-up: Return to clinic in 3 months.  Yvonne Kendall, MD 10/04/2019 11:48 AM

## 2019-10-04 ENCOUNTER — Ambulatory Visit (INDEPENDENT_AMBULATORY_CARE_PROVIDER_SITE_OTHER): Payer: BC Managed Care – PPO | Admitting: Internal Medicine

## 2019-10-04 ENCOUNTER — Other Ambulatory Visit: Payer: Self-pay

## 2019-10-04 ENCOUNTER — Encounter: Payer: Self-pay | Admitting: Internal Medicine

## 2019-10-04 VITALS — BP 110/80 | HR 77 | Temp 97.2°F | Ht 71.0 in | Wt 326.8 lb

## 2019-10-04 DIAGNOSIS — R5383 Other fatigue: Secondary | ICD-10-CM | POA: Diagnosis not present

## 2019-10-04 DIAGNOSIS — I428 Other cardiomyopathies: Secondary | ICD-10-CM | POA: Diagnosis not present

## 2019-10-04 DIAGNOSIS — I10 Essential (primary) hypertension: Secondary | ICD-10-CM

## 2019-10-04 DIAGNOSIS — I5032 Chronic diastolic (congestive) heart failure: Secondary | ICD-10-CM | POA: Diagnosis not present

## 2019-10-04 DIAGNOSIS — R0600 Dyspnea, unspecified: Secondary | ICD-10-CM

## 2019-10-04 DIAGNOSIS — R0609 Other forms of dyspnea: Secondary | ICD-10-CM

## 2019-10-04 MED ORDER — LISINOPRIL 2.5 MG PO TABS: 2.5000 mg | ORAL_TABLET | Freq: Every day | ORAL | 3 refills | Status: DC

## 2019-10-04 MED ORDER — CARVEDILOL 12.5 MG PO TABS: 12.5000 mg | ORAL_TABLET | Freq: Two times a day (BID) | ORAL | 3 refills | Status: DC

## 2019-10-04 NOTE — Patient Instructions (Signed)
Medication Instructions:  Your physician has recommended you make the following change in your medication:  1- START Lisinopril 2.5 mg by mouth daily.    If you need a refill on your cardiac medications before your next appointment, please call your pharmacy.   Lab work: Your physician recommends that you return for lab work in: [place].  BMET. - - Please go to the [facility]. You will check in at the front desk to the right as you walk into the atrium. Valet Parking is offered if needed. - No appointment needed. You may go any day between 7 am and 6 pm.  If you have labs (blood work) drawn today and your tests are completely normal, you will receive your results only by: [name] MyChart Message (if you have MyChart) OR . A paper copy in the mail If you have any lab test that is abnormal or we need to change your treatment, we will call you to review the results.  Testing/Procedures: NONE  Follow-Up: You have been referred to Dr Fransico Him for sleep study in [place] office. Someone should contact you to arrange.   At [facility], you and your health needs are our priority.  As part of our continuing mission to provide you with exceptional heart care, we have created designated Provider Care Teams.  These Care Teams include your primary Cardiologist (physician) and Advanced Practice Providers (APPs -  Physician Assistants and Nurse Practitioners) who all work together to provide you with the care you need, when you need it. You will need a follow up appointment in 3 months.   You may see Nelva Bush, MD or one of the following Advanced Practice Providers on your designated Care Team:   Murray Hodgkins, NP Christell Faith, PA-C . Marrianne Mood, PA-C

## 2019-10-05 ENCOUNTER — Encounter: Payer: Self-pay | Admitting: Internal Medicine

## 2019-10-05 ENCOUNTER — Telehealth: Payer: Self-pay | Admitting: *Deleted

## 2019-10-05 DIAGNOSIS — R0609 Other forms of dyspnea: Secondary | ICD-10-CM | POA: Insufficient documentation

## 2019-10-05 DIAGNOSIS — R5383 Other fatigue: Secondary | ICD-10-CM | POA: Insufficient documentation

## 2019-10-05 DIAGNOSIS — I428 Other cardiomyopathies: Secondary | ICD-10-CM | POA: Insufficient documentation

## 2019-10-05 NOTE — Telephone Encounter (Signed)
-----   Message from Vanessa Ralphs, RN sent at 10/04/2019 11:54 AM EDT ----- Regarding: Referral for sleep study for Dr Brett Albino,  I hope you're doing well. Dr End would like this patient to see Dr Radford Pax or Dr Claiborne Billings for sleep study. I have placed the order and put it under Dr Radford Pax to read. If this is the wrong process, please let me know what is best.   Does patient need to see Dr Radford Pax first?  THanks, Lars Pinks, RN

## 2019-10-05 NOTE — Telephone Encounter (Signed)
Split night sent to sleep pool. °

## 2019-10-06 ENCOUNTER — Telehealth: Payer: Self-pay | Admitting: *Deleted

## 2019-10-06 NOTE — Telephone Encounter (Signed)
Staff message sent to [place] per [place] no [place] is required for sleep study. Call reference # [id number].

## 2019-10-10 NOTE — Telephone Encounter (Addendum)
Patient is scheduled for lab study on 10/23/19. Pt  is scheduled for COVID screening on 10/20/19 12:15 prior to titration.   Patient understands his sleep study will be done at [facility] sleep lab. Patient understands he will receive a sleep packet in a week or so. Patient understands to call if he does not receive the sleep packet in a timely manner. Patient agrees with treatment and thanked me for call.

## 2019-10-13 ENCOUNTER — Other Ambulatory Visit: Payer: Self-pay | Admitting: Internal Medicine

## 2019-10-13 ENCOUNTER — Ambulatory Visit: Payer: BC Managed Care – PPO | Admitting: Internal Medicine

## 2019-10-17 ENCOUNTER — Telehealth: Payer: Self-pay | Admitting: Internal Medicine

## 2019-10-17 DIAGNOSIS — R5382 Chronic fatigue, unspecified: Secondary | ICD-10-CM

## 2019-10-17 MED ORDER — ESZOPICLONE 1 MG PO TABS: 2.0000 mg | ORAL_TABLET | Freq: Once | ORAL | Status: DC

## 2019-10-17 NOTE — Telephone Encounter (Signed)
Per request of sleep medicine team, Lunesta 2 mg x 1 has been prescribed for the patient to take upon arrival at the sleep center.  Nelva Bush, MD [facility] Pager: [phone]

## 2019-10-18 ENCOUNTER — Telehealth: Payer: Self-pay | Admitting: *Deleted

## 2019-10-18 MED ORDER — ESZOPICLONE 2 MG PO TABS: 2.0000 mg | ORAL_TABLET | Freq: Once | ORAL | 0 refills | Status: DC

## 2019-10-18 NOTE — Telephone Encounter (Signed)
Per request of sleep medicine team, Lunesta 2 mg x 1 has been prescribed for the patient to take upon arrival at the sleep center.  Christopher End, MD [facility] Pager: [phone]  

## 2019-10-18 NOTE — Telephone Encounter (Signed)
-----   Message from Nelva Bush, MD sent at 10/17/2019  1:21 PM EDT ----- Regarding: RE: SLEEP- AIDE Hi [name],  I am trying to prescribe the Lunesta but am having to work with [facility] to e-prescribe this controlled substance.  Hopefully I will have that resolved later today or tomorrow.  [name] ----- Message ----- From: Freada Bergeron, CMA Sent: 10/17/2019  11:33 AM EDT To: Nelva Bush, MD Subject: FW: SLEEP- AIDE                                Thanks  ----- Message ----- From: Freada Bergeron, CMA Sent: 10/12/2019  10:35 PM EDT To: Nelva Bush, MD Subject: FW: SLEEP- AIDE                                Dr Radford Pax always orders Lunesta 2mg  upon arrival in sleep lab once he is ready to go to sleep for strudy.  #1 tablet with no refills. Thanks ----- Message ----- From: Nelva Bush, MD Sent: 10/12/2019   9:23 PM EDT To: Freada Bergeron, CMA Subject: RE: SLEEP- AIDE                                Hi [name],  I have never had to prescribe a sleep aid for a sleep study.  What do you suggest?  Thanks.  [name] ----- Message ----- From: Freada Bergeron, CMA Sent: 10/12/2019   9:13 PM EDT To: Vanessa Ralphs, RN, Nelva Bush, MD Subject: Melton Alar: SLEEP- AIDE                                Please write for a sleep -aide please. Thanks, [name] ----- Message ----- From: Sueanne Margarita, MD Sent: 10/12/2019   8:53 AM EDT To: Freada Bergeron, CMA Subject: RE: SLEEP- AIDE                                 Have never seen this patient so ordering MD needs to order sleep aide ----- Message ----- From: Freada Bergeron, CMA Sent: 10/11/2019   5:26 PM EDT To: Sueanne Margarita, MD Subject: SLEEP- AIDE                                    Patient would like a sleep -aide for his sleep study.  Please advise

## 2019-10-18 NOTE — Telephone Encounter (Signed)
Per Lars Pinks she will contact the patient to inform him to pick up his sleep- aide.

## 2019-10-18 NOTE — Telephone Encounter (Signed)
Called patient and he verbalized understanding to take the Lunesta upon arrival to the sleep study facility. He also needed to schedule his 3 month follow up. Appointment scheduled.

## 2019-10-20 ENCOUNTER — Other Ambulatory Visit (HOSPITAL_COMMUNITY)
Admission: RE | Admit: 2019-10-20 | Discharge: 2019-10-20 | Disposition: A | Discharge status: Home / Self Care | Payer: BC Managed Care – PPO | Source: Ambulatory Visit | Attending: Cardiology | Admitting: Cardiology

## 2019-10-20 DIAGNOSIS — Z01812 Encounter for preprocedural laboratory examination: Secondary | ICD-10-CM | POA: Diagnosis present

## 2019-10-20 DIAGNOSIS — Z20828 Contact with and (suspected) exposure to other viral communicable diseases: Secondary | ICD-10-CM | POA: Insufficient documentation

## 2019-10-21 LAB — NOVEL CORONAVIRUS, NAA (HOSP ORDER, SEND-OUT TO REF LAB; TAT 18-24 HRS): SARS-CoV-2, NAA: NOT DETECTED

## 2019-10-23 ENCOUNTER — Other Ambulatory Visit: Payer: Self-pay

## 2019-10-23 ENCOUNTER — Ambulatory Visit (HOSPITAL_BASED_OUTPATIENT_CLINIC_OR_DEPARTMENT_OTHER): Payer: BC Managed Care – PPO | Attending: Internal Medicine | Admitting: Cardiology

## 2019-10-23 DIAGNOSIS — I5032 Chronic diastolic (congestive) heart failure: Secondary | ICD-10-CM | POA: Insufficient documentation

## 2019-10-23 DIAGNOSIS — G4733 Obstructive sleep apnea (adult) (pediatric): Secondary | ICD-10-CM | POA: Insufficient documentation

## 2019-10-23 DIAGNOSIS — R5383 Other fatigue: Secondary | ICD-10-CM | POA: Insufficient documentation

## 2019-10-23 DIAGNOSIS — I493 Ventricular premature depolarization: Secondary | ICD-10-CM | POA: Diagnosis not present

## 2019-10-24 ENCOUNTER — Telehealth: Payer: Self-pay | Admitting: *Deleted

## 2019-10-24 NOTE — Procedures (Signed)
   Patient Name: [name] Schmidt, [name] Schmidt Date: 10/23/2019 Gender: Male D.O.B: 05/06/1966 Age (years): 52 Referring Provider: Nelva Bush MD Height (inches): 71 Interpreting Physician: Fransico Him MD, ABSM Weight (lbs): 315 RPSGT: Carolin Coy BMI: 44 MRN: [id number] Neck Size: 17.75  CLINICAL INFORMATION Sleep Study Type: NPSG  Indication for sleep study: Fatigue, Hypertension, Obesity, Snoring  Epworth Sleepiness Score: 11  SLEEP STUDY TECHNIQUE As per the AASM Manual for the Scoring of Sleep and Associated Events v2.3 (April 2016) with a hypopnea requiring 4% desaturations.  The channels recorded and monitored were frontal, central and occipital EEG, electrooculogram (EOG), submentalis EMG (chin), nasal and oral airflow, thoracic and abdominal wall motion, anterior tibialis EMG, snore microphone, electrocardiogram, and pulse oximetry.  MEDICATIONS Medications self-administered by patient taken the night of the study : eszopiclone  SLEEP ARCHITECTURE The study was initiated at 10:21:03 PM and ended at 4:34:40 AM.  Sleep onset time was 75.4 minutes and the sleep efficiency was 50.3%. The total sleep time was 188 minutes.  Stage REM latency was N/A minutes.  The patient spent 10.1% of the night in stage N1 sleep, 89.9% in stage N2 sleep, 0.0% in stage N3 and 0% in REM.  Alpha intrusion was absent.  Supine sleep was 7.18%.  RESPIRATORY PARAMETERS The overall apnea/hypopnea index (AHI) was 10.5 per hour. There were 4 total apneas, including 4 obstructive, 0 central and 0 mixed apneas. There were 29 hypopneas and 130 RERAs.  The AHI during Stage REM sleep was N/A per hour.  AHI while supine was 84.4 per hour.  The mean oxygen saturation was 93.5%. The minimum SpO2 during sleep was 89.0%.  moderate snoring was noted during this study.  CARDIAC DATA The 2 lead EKG demonstrated sinus rhythm. The mean heart rate was 69.1 beats per minute. Other EKG findings include:  PVCs.  LEG MOVEMENT DATA The total PLMS were 0 with a resulting PLMS index of 0.0. Associated arousal with leg movement index was 0.3 .  IMPRESSIONS - Mild obstructive sleep apnea occurred during this study (AHI = 10.5/h). - No significant central sleep apnea occurred during this study (CAI = 0.0/h). - The patient had minimal or no oxygen desaturation during the study (Min O2 = 89.0%) - The patient snored with moderate snoring volume. - EKG findings include PVCs. - Clinically significant periodic limb movements did not occur during sleep. No significant associated arousals.  DIAGNOSIS - Obstructive Sleep Apnea (327.23 [G47.33 ICD-10])  RECOMMENDATIONS  - Therapeutic CPAP titration to determine optimal pressure required to alleviate sleep disordered breathing. - Positional therapy avoiding supine position during sleep. - Avoid alcohol, sedatives and other CNS depressants that may worsen sleep apnea and disrupt normal sleep architecture. - Sleep hygiene should be reviewed to assess factors that may improve sleep quality. - Weight management and regular exercise should be initiated or continued if appropriate.  [Electronically signed] 10/24/2019 10:10 AM  Fransico Him MD, ABSM Diplomate, American Board of Sleep Medicine

## 2019-10-24 NOTE — Telephone Encounter (Signed)
-----   Message from Traci R Turner, MD sent at 10/24/2019 10:13 AM EST ----- Please let patient know that they have sleep apnea and recommend CPAP titration. Please set up titration in the sleep lab.  

## 2019-10-24 NOTE — Telephone Encounter (Signed)
Informed patient of sleep study results and patient understanding was verbalized. Patient understands his sleep study showed  they have sleep apnea and recommend CPAP titration. Please set up titration in the sleep lab.    

## 2019-11-03 ENCOUNTER — Telehealth: Payer: Self-pay | Admitting: *Deleted

## 2019-11-03 DIAGNOSIS — R0683 Snoring: Secondary | ICD-10-CM

## 2019-11-03 DIAGNOSIS — R5383 Other fatigue: Secondary | ICD-10-CM

## 2019-11-03 NOTE — Telephone Encounter (Signed)
Informed patient of sleep study results and patient understanding was verbalized. Patient understands his sleep study showed they have sleep apnea and recommend CPAP titration. Please set up titration in the sleep lab.  Pt is aware and agreeable to his results.  Titration sent to sleep pool   

## 2019-11-03 NOTE — Telephone Encounter (Signed)
-----   Message from Sueanne Margarita, MD sent at 10/24/2019 10:13 AM EST ----- Please let patient know that they have sleep apnea and recommend CPAP titration. Please set up titration in the sleep lab.

## 2019-11-07 ENCOUNTER — Telehealth: Payer: Self-pay | Admitting: *Deleted

## 2019-11-07 ENCOUNTER — Encounter: Payer: Self-pay | Admitting: *Deleted

## 2019-11-07 NOTE — Telephone Encounter (Signed)
Staff message sent to [name] ok to schedule CPAP titration. [place] no [place] required.

## 2019-11-07 NOTE — Telephone Encounter (Signed)
Patient due for BMET.  Reminder letter mailed to patient.

## 2019-11-13 NOTE — Addendum Note (Signed)
Addended by: Freada Bergeron on: 11/13/2019 02:01 PM   Modules accepted: Orders

## 2019-11-20 NOTE — Telephone Encounter (Addendum)
Patient is scheduled for lab study on 12/27/19. [name] Schmidt is scheduled for COVID screening on 12/25/19 3:30  prior to titration.  Patient understands his sleep study will be done at [facility] sleep lab. Patient understands he will receive a sleep packet in a week or so. Patient understands to call if he does not receive the sleep packet in a timely manner. Patient agrees with treatment and thanked me for call.

## 2019-11-23 NOTE — Telephone Encounter (Signed)
Patient has to cancel at this time due to work conflict. He needs a 3:30 appointment and he does not get off until 3:30.

## 2019-11-25 ENCOUNTER — Other Ambulatory Visit (HOSPITAL_COMMUNITY): Payer: BC Managed Care – PPO

## 2019-11-28 ENCOUNTER — Encounter (HOSPITAL_BASED_OUTPATIENT_CLINIC_OR_DEPARTMENT_OTHER): Payer: BC Managed Care – PPO | Admitting: Cardiology

## 2019-12-07 ENCOUNTER — Telehealth: Payer: Self-pay | Admitting: *Deleted

## 2019-12-07 DIAGNOSIS — G4733 Obstructive sleep apnea (adult) (pediatric): Secondary | ICD-10-CM

## 2019-12-07 NOTE — Telephone Encounter (Signed)
-----   Message from Lauralee Evener, [place] sent at 11/07/2019 12:12 PM EST ----- Regarding: RE: precert Ok to schedule. [place] no [place] required. ----- Message ----- From: Freada Bergeron, CMA Sent: 11/03/2019   6:17 PM EST To: Cv Div Sleep Studies Subject: precert                                        recommend CPAP titration

## 2019-12-11 NOTE — Telephone Encounter (Signed)
Patient is scheduled for lab study on 12/27/19. [name] Schmidt is scheduled for COVID screening on 12/25/19 3:30  prior to titration.

## 2019-12-16 ENCOUNTER — Other Ambulatory Visit: Payer: Self-pay | Admitting: Internal Medicine

## 2019-12-25 ENCOUNTER — Other Ambulatory Visit (HOSPITAL_COMMUNITY)
Admission: RE | Admit: 2019-12-25 | Discharge: 2019-12-25 | Disposition: A | Discharge status: Home / Self Care | Payer: BC Managed Care – PPO | Source: Ambulatory Visit | Attending: Cardiology | Admitting: Cardiology

## 2019-12-25 DIAGNOSIS — Z20822 Contact with and (suspected) exposure to covid-19: Secondary | ICD-10-CM | POA: Insufficient documentation

## 2019-12-25 DIAGNOSIS — Z01812 Encounter for preprocedural laboratory examination: Secondary | ICD-10-CM | POA: Diagnosis present

## 2019-12-25 LAB — SARS CORONAVIRUS 2 (TAT 6-24 HRS): SARS Coronavirus 2: NEGATIVE

## 2019-12-27 ENCOUNTER — Ambulatory Visit (HOSPITAL_BASED_OUTPATIENT_CLINIC_OR_DEPARTMENT_OTHER): Payer: BC Managed Care – PPO | Attending: Internal Medicine | Admitting: Cardiology

## 2019-12-27 ENCOUNTER — Other Ambulatory Visit: Payer: Self-pay

## 2019-12-27 VITALS — Ht 71.0 in | Wt 310.0 lb

## 2019-12-27 DIAGNOSIS — R0683 Snoring: Secondary | ICD-10-CM

## 2019-12-27 DIAGNOSIS — G4733 Obstructive sleep apnea (adult) (pediatric): Secondary | ICD-10-CM | POA: Diagnosis present

## 2019-12-27 DIAGNOSIS — R5383 Other fatigue: Secondary | ICD-10-CM

## 2019-12-28 ENCOUNTER — Telehealth: Payer: Self-pay | Admitting: *Deleted

## 2019-12-28 NOTE — Telephone Encounter (Signed)
Informed patient of titration results and verbalized understanding was indicated. Patient understands his sleep study showed they had a successful PAP titration and let DME know that orders are in EPIC. Please set up 10 week OV with me.   Upon patient request DME selection is [facility]. Patient understands he will be contacted by [facility] to set up his cpap. Patient understands to call if [facility] does not contact him with new setup in a timely manner. Patient understands they will be called once confirmation has been received from [facility] that they have received their new machine to schedule 10 week follow up appointment.  [facility] notified of new cpap order  Please add to airview Patient was grateful for the call and thanked me.   Order placed via community message

## 2019-12-28 NOTE — Procedures (Signed)
   Patient Name: [name] Schmidt, [name] Schmidt Date: 12/27/2019 Gender: Male D.O.B: 23-Jun-1966 Age (years): 48 Referring Provider: Yvonne Kendall MD Height (inches): 71 Interpreting Physician: Armanda Magic MD, ABSM Weight (lbs): 310 RPSGT: Ulyess Mort BMI: 43 MRN: [id number] Neck Size: 17.50  CLINICAL INFORMATION The patient is referred for a CPAP titration to treat sleep apnea.  SLEEP STUDY TECHNIQUE As per the AASM Manual for the Scoring of Sleep and Associated Events v2.3 (April 2016) with a hypopnea requiring 4% desaturations.  The channels recorded and monitored were frontal, central and occipital EEG, electrooculogram (EOG), submentalis EMG (chin), nasal and oral airflow, thoracic and abdominal wall motion, anterior tibialis EMG, snore microphone, electrocardiogram, and pulse oximetry. Bilevel positive airway pressure (BPAP) was initiated at the beginning of the study and titrated to treat sleep-disordered breathing.  MEDICATIONS Medications self-administered by patient taken the night of the study : eszopiclone  RESPIRATORY PARAMETERS Optimal CPAP Pressure (cm):18  AHI at Optimal Pressure (/hr) 2.4 Overall Minimal O2 (%):89.0  Minimal O2 at Optimal Pressure (%): 94.0  SLEEP ARCHITECTURE Start Time:9:26:46 PM  Stop Time:4:58:49 AM  Total Time (min):452.1  Total Sleep Time (min):302.5 Sleep Latency (min):34.0  Sleep Efficiency (%):66.9%  REM Latency (min):75.5  WASO (min):115.5 Stage N1 (%): 7.4%  Stage N2 (%): 66.3%  Stage N3 (%): 0.0%  Stage R (%): 26.3 Supine (%):55.54  Arousal Index (/hr):26.8   CARDIAC DATA The 2 lead EKG demonstrated sinus rhythm. The mean heart rate was 66.3 beats per minute. Other EKG findings include: PVCs.  LEG MOVEMENT DATA The total Periodic Limb Movements of Sleep (PLMS) were 0. The PLMS index was 0.0. A PLMS index of <15 is considered normal in adults.  IMPRESSIONS - An optimal PAP pressure was selected for this patient ( 24 / cm of  water) - Central sleep apnea was not noted during this titration (CAI = 0.2/h). - Mild oxygen desaturations were observed during this titration (min O2 = 89.0%). - The patient snored with loud snoring volume. - 2-lead EKG demonstrated: PVCs - Clinically significant periodic limb movements were not noted during this study. Arousals associated with PLMs were rare.  DIAGNOSIS - Obstructive Sleep Apnea (327.23 [G47.33 ICD-10])  RECOMMENDATIONS - Trial of CPAP therapy on 18cm H2O with a Medium size Fisher&Paykel Full Face Mask F&P Vitera (new) mask and heated humidification. - Avoid alcohol, sedatives and other CNS depressants that may worsen sleep apnea and disrupt normal sleep architecture. - Sleep hygiene should be reviewed to assess factors that may improve sleep quality. - Weight management and regular exercise should be initiated or continued. - Return to Sleep Center for re-evaluation after 10 weeks of therapy  [Electronically signed] 12/28/2019 03:12 PM  Armanda Magic MD, ABSM Diplomate, American Board of Sleep Medicine

## 2019-12-28 NOTE — Telephone Encounter (Signed)
-----   Message from Quintella Reichert, MD sent at 12/28/2019  3:16 PM EST ----- Please let patient know that they had a successful PAP titration and let DME know that orders are in EPIC.  Please set up 10 week OV with me.

## 2020-01-04 ENCOUNTER — Ambulatory Visit: Payer: BC Managed Care – PPO | Admitting: Internal Medicine

## 2020-01-04 NOTE — Progress Notes (Signed)
Follow-up Outpatient Visit Date: 01/05/2020  Primary Care Provider: Nonnie Done., MD [address] [facility] [place] [address]  Chief Complaint: Follow-up coronary artery disease  HPI:  [name] Schmidt is a 54 y.o. male with history of coronary artery disease with lateral STEMI on 04/04/18,hypertension,hyperlipidemia, mild OSA, GERD, tobacco use, and morbid obesity, who presents for follow-up of coronary artery disease.  I last saw [name] Schmidt in mid October following diagnostic catheterization that showed patent diagonal stent and nonobstructive CAD (iFR of LAD was 0.90).  His chest pain had resolved after addition of isosorbide mononitrate.  He was diagnosed with mild OSA in November and underwent CPAP titration earlier this month.  Today, [name] Schmidt reports that he feels well.  He denies chest pain.  He has stable dyspnea on exertion, which he attributes primarily to weight gain and inactivity since no longer being able to use the gym due to COVID-19.  He has occasional orthostatic lightheadedness and mild edema.  He is awaiting insurance approval for his CPAP.  He is tolerating his current medications well.  --------------------------------------------------------------------------------------------------  Past Medical History:  Diagnosis Date  . (HFpEF) heart failure with preserved ejection fraction (HCC)   . CKD (chronic kidney disease), stage III   . Coronary artery disease    a. 03/2018 Lateral STEMI/PCI: LMnl, LAD 20p, 37m, D2 100ost (2.25x12 Resolute Onyx DES), LCX large/nl, OM1/2 large/nl, RCA large, min irregs, RPDA/RPAV large/nl.  . GERD (gastroesophageal reflux disease)   . Hyperlipidemia LDL goal <70   . Hypertension   . Morbid obesity (HCC)   . STEMI (ST elevation myocardial infarction) (HCC)    4/19 PCI/DES x1 to D2.  . Tobacco abuse    Past Surgical History:  Procedure Laterality Date  . CORONARY/GRAFT ACUTE MI REVASCULARIZATION N/A 04/04/2018   Procedure: Coronary/Graft  Acute MI Revascularization;  Surgeon: Yvonne Kendall, MD;  [facility]: [facility];  Service: Cardiovascular;  Laterality: N/A;  . INTRAVASCULAR PRESSURE WIRE/FFR STUDY N/A 09/14/2019   Procedure: INTRAVASCULAR PRESSURE WIRE/FFR STUDY;  Surgeon: Yvonne Kendall, MD;  [facility]: [facility];  Service: Cardiovascular;  Laterality: N/A;  . LEFT HEART CATH AND CORONARY ANGIOGRAPHY N/A 04/04/2018   Procedure: LEFT HEART CATH AND CORONARY ANGIOGRAPHY;  Surgeon: Yvonne Kendall, MD;  [facility];  Service: Cardiovascular;  Laterality: N/A;  . LEFT HEART CATH AND CORONARY ANGIOGRAPHY N/A 09/14/2019   Procedure: LEFT HEART CATH AND CORONARY ANGIOGRAPHY;  Surgeon: Yvonne Kendall, MD;  [facility];  Service: Cardiovascular;  Laterality: N/A;    Current Meds  Medication Sig  . aspirin 81 MG chewable tablet Chew 1 tablet (81 mg total) by mouth daily.  [name] atorvastatin (LIPITOR) 40 MG tablet TAKE 1 TABLET BY MOUTH EVERY DAY  . calcium carbonate (TUMS - DOSED IN MG ELEMENTAL CALCIUM) 500 MG chewable tablet Chew 1 tablet by mouth daily as needed for indigestion or heartburn.  . carvedilol (COREG) 12.5 MG tablet Take 1 tablet (12.5 mg total) by mouth 2 (two) times daily with a meal.  . clopidogrel (PLAVIX) 75 MG tablet Take 1 tablet (75 mg total) by mouth daily.  . furosemide (LASIX) 20 MG tablet TAKE 1 TABLET BY MOUTH DAILY AS NEEDED (FOR WEIGHT GAIN AND/OR SWELLING).  . nitroGLYCERIN (NITROSTAT) 0.4 MG SL tablet Place 1 tablet (0.4 mg total) under the tongue every 5 (five) minutes as needed.  [name] omeprazole (PRILOSEC) 40 MG capsule Take 40 mg by mouth daily.  [name] Phenylephrine-APAP-guaiFENesin (TYLENOL  SINUS SEVERE) 5-325-200 MG TABS Take 1 tablet by mouth daily as needed (allergies).  . tamsulosin (FLOMAX) 0.4 MG CAPS capsule Take 0.4 mg by mouth every evening.     Allergies: Patient has no known allergies.  Social History   Tobacco Use  . Smoking status:  Former Smoker    Packs/day: 0.50    Types: Cigarettes    Quit date: 09/04/2019    Years since quitting: 0.3  . Smokeless tobacco: Never Used  Substance Use Topics  . Alcohol use: Not Currently  . Drug use: Never    Family History  Problem Relation Age of Onset  . Cancer Mother     Review of Systems: A 12-system review of systems was performed and was negative except as noted in the HPI.  --------------------------------------------------------------------------------------------------  Physical Exam: BP 110/72 (BP Location: Left Arm, Patient Position: Sitting, Cuff Size: Large)   Pulse 78   Ht 5\' 11"  (1.803 m)   Wt (!) 327 lb 8 oz (148.6 kg)   SpO2 98%   BMI 45.68 kg/m   General:  NAD. HEENT: No conjunctival pallor or scleral icterus. Facemask in place. Neck: Supple without lymphadenopathy, thyromegaly, JVD, or HJR. Lungs: Normal work of breathing. Clear to auscultation bilaterally without wheezes or crackles. Heart: Regular rate and rhythm without murmurs, rubs, or gallops.  Unable to assess PMI due ot body habitus. Abd: Bowel sounds present. Soft, NT/ND.  Unable to assess HSM due to body habitus. Ext: No lower extremity edema. Skin: Warm and dry without rash.  EKG:  NSR with low voltage and septal Q waves.  Septal Q waves are new since 09/2019 - question lead placement.  Lab Results  Component Value Date   WBC 5.9 09/07/2019   HGB 16.1 09/07/2019   HCT 49.3 09/07/2019   MCV 88 09/07/2019   PLT 134 (L) 09/07/2019    Lab Results  Component Value Date   NA 143 09/07/2019   K 4.0 09/07/2019   CL 109 (H) 09/07/2019   CO2 20 09/07/2019   BUN 28 (H) 09/07/2019   CREATININE 1.11 09/07/2019   GLUCOSE 106 (H) 09/07/2019   ALT 32 06/30/2018    Lab Results  Component Value Date   CHOL 87 06/30/2018   HDL 42 06/30/2018   LDLCALC 35 06/30/2018   TRIG 51 06/30/2018   CHOLHDL 2.1 06/30/2018     --------------------------------------------------------------------------------------------------  ASSESSMENT AND PLAN: Coronary artery disease with stable angina: [name] Schmidt is doing well without further chest pain on low-dose isosorbide mononitrate and carvedilol.  Continue indefinite dual antiplatelet therapy with aspirin and clopidogrel as well as high intensity statin therapy.  Hypertension: Blood pressure well controlled.  Continue carvedilol 12.5 mg twice daily, isosorbide mononitrate 15 mg daily, and lisinopril 2.5 mg daily.  Hyperlipidemia: LDL at goal on last check in 2019.  We will repeat a lipid panel and ALT today.  In the meantime, continue atorvastatin 40 mg daily.  Obstructive sleep apnea: Sleep study in November demonstrated mild OSA.  [name] Schmidt recently underwent CPAP titration and is awaiting insurance approval for his device.  Morbid obesity: Weight loss encouraged through diet and exercise, given BMI greater than 40 and multiple comorbidities.  Follow-up: Return to clinic in 6 months.  Nelva Bush, MD 01/05/2020 10:06 AM

## 2020-01-05 ENCOUNTER — Encounter: Payer: Self-pay | Admitting: Internal Medicine

## 2020-01-05 ENCOUNTER — Other Ambulatory Visit: Payer: Self-pay

## 2020-01-05 ENCOUNTER — Ambulatory Visit (INDEPENDENT_AMBULATORY_CARE_PROVIDER_SITE_OTHER): Payer: BC Managed Care – PPO | Admitting: Internal Medicine

## 2020-01-05 VITALS — BP 110/72 | HR 78 | Ht 71.0 in | Wt 327.5 lb

## 2020-01-05 DIAGNOSIS — I25118 Atherosclerotic heart disease of native coronary artery with other forms of angina pectoris: Secondary | ICD-10-CM

## 2020-01-05 DIAGNOSIS — I10 Essential (primary) hypertension: Secondary | ICD-10-CM

## 2020-01-05 DIAGNOSIS — I5032 Chronic diastolic (congestive) heart failure: Secondary | ICD-10-CM | POA: Diagnosis not present

## 2020-01-05 DIAGNOSIS — G4733 Obstructive sleep apnea (adult) (pediatric): Secondary | ICD-10-CM

## 2020-01-05 DIAGNOSIS — E785 Hyperlipidemia, unspecified: Secondary | ICD-10-CM | POA: Diagnosis not present

## 2020-01-05 NOTE — Patient Instructions (Signed)
Medication Instructions:  Your physician recommends that you continue on your current medications as directed. Please refer to the Current Medication list given to you today.  *If you need a refill on your cardiac medications before your next appointment, please call your pharmacy*  Lab Work: Your physician recommends that you return for lab work in: TODAY - LIPID, ALT.  If you have labs (blood work) drawn today and your tests are completely normal, you will receive your results only by: [name] MyChart Message (if you have MyChart) OR . A paper copy in the mail If you have any lab test that is abnormal or we need to change your treatment, we will call you to review the results.  Testing/Procedures: none  Follow-Up: At [facility], you and your health needs are our priority.  As part of our continuing mission to provide you with exceptional heart care, we have created designated Provider Care Teams.  These Care Teams include your primary Cardiologist (physician) and Advanced Practice Providers (APPs -  Physician Assistants and Nurse Practitioners) who all work together to provide you with the care you need, when you need it.  Your next appointment:   6 month(s)  The format for your next appointment:   In Person  Provider:    You may see Yvonne Kendall, MD or one of the following Advanced Practice Providers on your designated Care Team:    Nicolasa Ducking, NP  Eula Listen, PA-C  Marisue Ivan, PA-C

## 2020-01-06 LAB — LIPID PANEL
Chol/HDL Ratio: 3 ratio (ref 0.0–5.0)
Cholesterol, Total: 96 mg/dL — ABNORMAL LOW (ref 100–199)
HDL: 32 mg/dL — ABNORMAL LOW (ref >39)
LDL Chol Calc (NIH): 39 mg/dL (ref 0–99)
Triglycerides: 144 mg/dL (ref 0–149)
VLDL Cholesterol Cal: 25 mg/dL (ref 5–40)

## 2020-01-06 LAB — ALT: ALT: 17 IU/L (ref 0–44)

## 2020-01-11 ENCOUNTER — Telehealth: Payer: Self-pay | Admitting: *Deleted

## 2020-01-11 ENCOUNTER — Encounter: Payer: Self-pay | Admitting: *Deleted

## 2020-01-11 NOTE — Telephone Encounter (Signed)
-----   Message from Yvonne Kendall, MD sent at 01/06/2020  8:35 PM EST ----- LDL very well-controlled.  ALT normal.  Continue current medications.

## 2020-01-12 ENCOUNTER — Other Ambulatory Visit: Payer: Self-pay | Admitting: Internal Medicine

## 2020-03-14 ENCOUNTER — Other Ambulatory Visit: Payer: Self-pay | Admitting: Internal Medicine

## 2020-04-25 ENCOUNTER — Other Ambulatory Visit: Payer: Self-pay | Admitting: Internal Medicine

## 2020-06-13 ENCOUNTER — Other Ambulatory Visit: Payer: Self-pay | Admitting: Internal Medicine

## 2020-07-10 ENCOUNTER — Ambulatory Visit: Payer: BC Managed Care – PPO | Admitting: Internal Medicine

## 2020-07-13 ENCOUNTER — Other Ambulatory Visit: Payer: Self-pay | Admitting: Internal Medicine

## 2020-07-24 ENCOUNTER — Other Ambulatory Visit: Payer: Self-pay | Admitting: Internal Medicine

## 2020-08-30 ENCOUNTER — Other Ambulatory Visit: Payer: Self-pay | Admitting: Internal Medicine

## 2020-09-09 ENCOUNTER — Other Ambulatory Visit: Payer: Self-pay | Admitting: Internal Medicine

## 2020-09-10 ENCOUNTER — Other Ambulatory Visit: Payer: Self-pay | Admitting: Internal Medicine

## 2020-09-11 ENCOUNTER — Ambulatory Visit: Payer: BC Managed Care – PPO | Admitting: Internal Medicine

## 2020-09-11 ENCOUNTER — Other Ambulatory Visit: Payer: Self-pay

## 2020-09-11 ENCOUNTER — Encounter: Payer: Self-pay | Admitting: Internal Medicine

## 2020-09-11 VITALS — BP 110/80 | HR 79 | Ht 71.0 in | Wt 336.1 lb

## 2020-09-11 DIAGNOSIS — R140 Abdominal distension (gaseous): Secondary | ICD-10-CM | POA: Diagnosis not present

## 2020-09-11 DIAGNOSIS — E785 Hyperlipidemia, unspecified: Secondary | ICD-10-CM

## 2020-09-11 DIAGNOSIS — I10 Essential (primary) hypertension: Secondary | ICD-10-CM

## 2020-09-11 DIAGNOSIS — I25118 Atherosclerotic heart disease of native coronary artery with other forms of angina pectoris: Secondary | ICD-10-CM | POA: Diagnosis not present

## 2020-09-11 DIAGNOSIS — R1013 Epigastric pain: Secondary | ICD-10-CM | POA: Insufficient documentation

## 2020-09-11 DIAGNOSIS — G4733 Obstructive sleep apnea (adult) (pediatric): Secondary | ICD-10-CM

## 2020-09-11 NOTE — Progress Notes (Signed)
Follow-up Outpatient Visit Date: 09/11/2020  Primary Care Provider: Nonnie Done., MD [address] [facility] [place] [address]  Chief Complaint: Follow-up coronary artery disease  HPI:  [name] is a 54 y.o. male with history of coronary artery disease with lateral STEMI on 04/04/18,hypertension,hyperlipidemia, mild OSA, GERD, tobacco use, and morbid obesity, who presents for follow-up of coronary artery disease.  I last saw him in January, at which time he was feeling well without further chest pain.  He reported stable dyspnea on exertion that he attributed to inactivity and weight gain.  He was waiting on insurance approval for his CPAP machine.  He tried wearing the CPAP for several months but had a hard time sleeping with it.  He is not using it currently.  From a heart standpoint, [name] has been doing well.  He denies chest pain, shortness of breath, palpitations, and lightheadedness.  He has noticed occasional swelling of his fingers and feels like he might need to restart his as needed furosemide.  He also notes intermittent bloating and epigastric pain.  He describes a sharp and cramp-like discomfort that resolves when he eats and drinks.  It is different than what he experienced in the past prior to cholecystectomy.  --------------------------------------------------------------------------------------------------  Past Medical History:  Diagnosis Date  . (HFpEF) heart failure with preserved ejection fraction (HCC)   . CKD (chronic kidney disease), stage III   . Coronary artery disease    a. 03/2018 Lateral STEMI/PCI: LMnl, LAD 20p, 74m, D2 100ost (2.25x12 Resolute Onyx DES), LCX large/nl, OM1/2 large/nl, RCA large, min irregs, RPDA/RPAV large/nl.  . GERD (gastroesophageal reflux disease)   . Hyperlipidemia LDL goal <70   . Hypertension   . Morbid obesity (HCC)   . STEMI (ST elevation myocardial infarction) (HCC)    4/19 PCI/DES x1 to D2.  . Tobacco abuse     Recent  CV Pertinent Labs: Lab Results  Component Value Date   CHOL 96 (L) 01/05/2020   HDL 32 (L) 01/05/2020   LDLCALC 39 01/05/2020   TRIG 144 01/05/2020   CHOLHDL 3.0 01/05/2020   CHOLHDL 2.1 06/30/2018   INR 1.01 04/04/2018   BNP 51.0 06/30/2018   K 4.0 09/07/2019   BUN 28 (H) 09/07/2019   CREATININE 1.11 09/07/2019    Past medical and surgical history were reviewed and updated in EPIC.  Current Meds  Medication Sig  . aspirin 81 MG chewable tablet Chew 1 tablet (81 mg total) by mouth daily.  [name] atorvastatin (LIPITOR) 40 MG tablet TAKE 1 TABLET BY MOUTH EVERY DAY  . calcium carbonate (TUMS - DOSED IN MG ELEMENTAL CALCIUM) 500 MG chewable tablet Chew 1 tablet by mouth daily as needed for indigestion or heartburn.  . carvedilol (COREG) 12.5 MG tablet TAKE 1 TABLET BY MOUTH TWICE A DAY WITH MEALS  . clopidogrel (PLAVIX) 75 MG tablet TAKE 1 TABLET BY MOUTH EVERY DAY  . furosemide (LASIX) 20 MG tablet TAKE 1 TABLET BY MOUTH EVERY DAY AS NEEDED FOR WEIGHT GAIN AND SWELLING  . isosorbide mononitrate (IMDUR) 30 MG 24 hr tablet TAKE 1/2 TABLET BY MOUTH ONCE A DAY  . lisinopril (ZESTRIL) 2.5 MG tablet Take 1 tablet (2.5 mg total) by mouth daily.  . nitroGLYCERIN (NITROSTAT) 0.4 MG SL tablet Place 1 tablet (0.4 mg total) under the tongue every 5 (five) minutes as needed.  [name] omeprazole (PRILOSEC) 40 MG capsule Take 40 mg by mouth daily.  [name] Phenylephrine-APAP-guaiFENesin (TYLENOL SINUS SEVERE) 5-325-200 MG TABS  Take 1 tablet by mouth daily as needed (allergies).  . tamsulosin (FLOMAX) 0.4 MG CAPS capsule Take 0.4 mg by mouth every evening.     Allergies: Patient has no known allergies.  Social History   Tobacco Use  . Smoking status: Former Smoker    Packs/day: 0.50    Types: Cigarettes    Quit date: 09/04/2019    Years since quitting: 1.0  . Smokeless tobacco: Never Used  Vaping Use  . Vaping Use: Never used  Substance Use Topics  . Alcohol use: Not Currently  . Drug use: Never     Family History  Problem Relation Age of Onset  . Cancer Mother     Review of Systems: A 12-system review of systems was performed and was negative except as noted in the HPI.  --------------------------------------------------------------------------------------------------  Physical Exam: BP 110/80 (BP Location: Left Arm, Patient Position: Sitting, Cuff Size: Large)   Pulse 79   Ht 5\' 11"  (1.803 m)   Wt (!) 336 lb 2 oz (152.5 kg)   SpO2 98%   BMI 46.88 kg/m   General: NAD. Neck: No JVD or HJR, though body habitus limits evaluation. Lungs: Clear to auscultation without wheezes or crackles. Heart: Distant heart sounds.  Regular rate and rhythm without murmurs. Abdomen: Obese and mildly tender in the epigastrium. Extremities: No lower extremity edema.  EKG: Normal sinus rhythm with low voltage.  Otherwise, no significant abnormality.  Lab Results  Component Value Date   WBC 5.9 09/07/2019   HGB 16.1 09/07/2019   HCT 49.3 09/07/2019   MCV 88 09/07/2019   PLT 134 (L) 09/07/2019    Lab Results  Component Value Date   NA 143 09/07/2019   K 4.0 09/07/2019   CL 109 (H) 09/07/2019   CO2 20 09/07/2019   BUN 28 (H) 09/07/2019   CREATININE 1.11 09/07/2019   GLUCOSE 106 (H) 09/07/2019   ALT 17 01/05/2020    Lab Results  Component Value Date   CHOL 96 (L) 01/05/2020   HDL 32 (L) 01/05/2020   LDLCALC 39 01/05/2020   TRIG 144 01/05/2020   CHOLHDL 3.0 01/05/2020    --------------------------------------------------------------------------------------------------  ASSESSMENT AND PLAN: Coronary artery disease with stable angina: [name] is doing well without recurrent angina.  We will continue his current antianginal regimen consisting of isosorbide mononitrate 15 mg daily and carvedilol 12.5 mg twice daily.  In light of his intermittent epigastric discomfort and the fact that he is over 2 years out from his STEMI and PCI, we have agreed to discontinue aspirin and  maintain single antiplatelet therapy with clopidogrel 75 mg daily.  We will check a CBC and CMP today.  Hyperlipidemia: LDL at goal.  Continue atorvastatin 40 mg daily.  Hypertension: Blood pressure reasonably well controlled.  No medication changes at this time.  We will check a CBC and CMP today.  Bloating and epigastric discomfort: Symptoms are nonspecific and seem to resolve with eating.  [name] is already on omeprazole.  We have agreed to discontinue aspirin in case this is contributing to some gastric irritation.  If symptoms persist, he should reach out to his PCP for further evaluation.  We will check a CBC and CMP today.  Obstructive sleep apnea: [name] has been intolerant of his CPAP device.  I encouraged him to reach out to his sleep specialist to determine if alternative masks are are available.  Morbid obesity: BMI greater than 40.  Weight loss encouraged exercise.  Follow-up: Return to  clinic in 6 months.  Yvonne Kendall, MD 09/11/2020 11:57 AM

## 2020-09-11 NOTE — Patient Instructions (Signed)
Medication Instructions:  Your physician has recommended you make the following change in your medication:  1- STOP Aspirin.  *If you need a refill on your cardiac medications before your next appointment, please call your pharmacy*  Lab Work: Your physician recommends that you return for lab work in: TODAY - CBC, CMP.  If you have labs (blood work) drawn today and your tests are completely normal, you will receive your results only by: [name] MyChart Message (if you have MyChart) OR . A paper copy in the mail If you have any lab test that is abnormal or we need to change your treatment, we will call you to review the results.  Testing/Procedures: none  Follow-Up: At [facility], you and your health needs are our priority.  As part of our continuing mission to provide you with exceptional heart care, we have created designated Provider Care Teams.  These Care Teams include your primary Cardiologist (physician) and Advanced Practice Providers (APPs -  Physician Assistants and Nurse Practitioners) who all work together to provide you with the care you need, when you need it.  We recommend signing up for the patient portal called "MyChart".  Sign up information is provided on this After Visit Summary.  MyChart is used to connect with patients for Virtual Visits (Telemedicine).  Patients are able to view lab/test results, encounter notes, upcoming appointments, etc.  Non-urgent messages can be sent to your provider as well.   To learn more about what you can do with MyChart, go to [email or web address].    Your next appointment:   6 month(s)  The format for your next appointment:   In Person  Provider:   You may see Yvonne Kendall, MD or one of the following Advanced Practice Providers on your designated Care Team:    Nicolasa Ducking, NP  Eula Listen, PA-C  Marisue Ivan, PA-C  Cadence [place], [place]

## 2020-09-12 LAB — COMPREHENSIVE METABOLIC PANEL
ALT: 22 IU/L (ref 0–44)
AST: 15 IU/L (ref 0–40)
Albumin/Globulin Ratio: 1.3 (ref 1.2–2.2)
Albumin: 4 g/dL (ref 3.8–4.9)
Alkaline Phosphatase: 214 IU/L — ABNORMAL HIGH (ref 44–121)
BUN/Creatinine Ratio: 18 (ref 9–20)
BUN: 23 mg/dL (ref 6–24)
Bilirubin Total: 0.5 mg/dL (ref 0.0–1.2)
CO2: 20 mmol/L (ref 20–29)
Calcium: 9.3 mg/dL (ref 8.7–10.2)
Chloride: 104 mmol/L (ref 96–106)
Creatinine, Ser: 1.26 mg/dL (ref 0.76–1.27)
GFR calc Af Amer: 75 mL/min/{1.73_m2} (ref >59)
GFR calc non Af Amer: 65 mL/min/{1.73_m2} (ref >59)
Globulin, Total: 3.1 g/dL (ref 1.5–4.5)
Glucose: 119 mg/dL — ABNORMAL HIGH (ref 65–99)
Potassium: 4.3 mmol/L (ref 3.5–5.2)
Sodium: 139 mmol/L (ref 134–144)
Total Protein: 7.1 g/dL (ref 6.0–8.5)

## 2020-09-12 LAB — CBC
Hematocrit: 46.7 % (ref 37.5–51.0)
Hemoglobin: 15.6 g/dL (ref 13.0–17.7)
MCH: 31.4 pg (ref 26.6–33.0)
MCHC: 33.4 g/dL (ref 31.5–35.7)
MCV: 94 fL (ref 79–97)
Platelets: 132 10*3/uL — ABNORMAL LOW (ref 150–450)
RBC: 4.97 x10E6/uL (ref 4.14–5.80)
RDW: 13.2 % (ref 11.6–15.4)
WBC: 6.4 10*3/uL (ref 3.4–10.8)

## 2020-09-27 ENCOUNTER — Other Ambulatory Visit: Payer: Self-pay | Admitting: Internal Medicine

## 2020-09-29 ENCOUNTER — Other Ambulatory Visit: Payer: Self-pay | Admitting: Internal Medicine

## 2020-10-08 ENCOUNTER — Other Ambulatory Visit: Payer: Self-pay | Admitting: Internal Medicine

## 2020-10-16 ENCOUNTER — Other Ambulatory Visit: Payer: Self-pay | Admitting: Internal Medicine

## 2020-10-20 ENCOUNTER — Other Ambulatory Visit: Payer: Self-pay | Admitting: Internal Medicine

## 2020-12-03 ENCOUNTER — Other Ambulatory Visit: Payer: Self-pay | Admitting: Internal Medicine

## 2021-01-11 ENCOUNTER — Other Ambulatory Visit: Payer: Self-pay | Admitting: Internal Medicine

## 2021-01-13 NOTE — Telephone Encounter (Signed)
Rx request sent to pharmacy.  

## 2021-02-10 ENCOUNTER — Other Ambulatory Visit: Payer: Self-pay | Admitting: Internal Medicine

## 2021-02-28 ENCOUNTER — Other Ambulatory Visit: Payer: Self-pay | Admitting: Internal Medicine

## 2021-03-12 ENCOUNTER — Ambulatory Visit: Payer: BC Managed Care – PPO | Admitting: Internal Medicine

## 2021-03-25 ENCOUNTER — Other Ambulatory Visit: Payer: Self-pay | Admitting: Internal Medicine

## 2021-04-03 ENCOUNTER — Encounter: Payer: Self-pay | Admitting: Internal Medicine

## 2021-04-23 ENCOUNTER — Other Ambulatory Visit: Payer: Self-pay

## 2021-04-23 ENCOUNTER — Ambulatory Visit: Payer: BC Managed Care – PPO | Admitting: Internal Medicine

## 2021-04-23 ENCOUNTER — Encounter: Payer: Self-pay | Admitting: Internal Medicine

## 2021-04-23 VITALS — BP 110/76 | HR 78 | Ht 71.0 in | Wt 338.0 lb

## 2021-04-23 DIAGNOSIS — I2510 Atherosclerotic heart disease of native coronary artery without angina pectoris: Secondary | ICD-10-CM

## 2021-04-23 DIAGNOSIS — I25118 Atherosclerotic heart disease of native coronary artery with other forms of angina pectoris: Secondary | ICD-10-CM | POA: Diagnosis not present

## 2021-04-23 DIAGNOSIS — I10 Essential (primary) hypertension: Secondary | ICD-10-CM | POA: Diagnosis not present

## 2021-04-23 DIAGNOSIS — E785 Hyperlipidemia, unspecified: Secondary | ICD-10-CM | POA: Diagnosis not present

## 2021-04-23 DIAGNOSIS — I5032 Chronic diastolic (congestive) heart failure: Secondary | ICD-10-CM

## 2021-04-23 MED ORDER — NITROGLYCERIN 0.4 MG SL SUBL: 0.4000 mg | SUBLINGUAL_TABLET | SUBLINGUAL | 2 refills | Status: DC | PRN

## 2021-04-23 NOTE — Progress Notes (Signed)
Follow-up Outpatient Visit Date: 04/23/2021  Primary Care Provider: Nonnie Done., MD [address] [facility] [place] [address]  Chief Complaint: Follow-up coronary artery disease  HPI:  Mr. Salazar is a 55 y.o. male with history of coronary artery disease with lateral STEMI on 04/04/18,hypertension,hyperlipidemia,mild OSA,GERD, tobacco use, and morbid obesity, who presents for follow-up of coronary artery disease.  I last saw him in 08/2020, which time Mr. Pollard was doing well.  He was using as needed furosemide for intermittent swelling of his fingers.  Today, Mr. Gurganus reports that he has been feeling well other than seasonal allergies that he experiences every year at this time.  He denies chest pain, shortness of breath, palpitations, lightheadedness, and edema.  He is tolerating his medications well.  He denies bleeding.  He remains very active at work but does not exercise regularly.  --------------------------------------------------------------------------------------------------  Past Medical History:  Diagnosis Date  . (HFpEF) heart failure with preserved ejection fraction (HCC)   . CKD (chronic kidney disease), stage III (HCC)   . Coronary artery disease    a. 03/2018 Lateral STEMI/PCI: LMnl, LAD 20p, 81m, D2 100ost (2.25x12 Resolute Onyx DES), LCX large/nl, OM1/2 large/nl, RCA large, min irregs, RPDA/RPAV large/nl.  . GERD (gastroesophageal reflux disease)   . Hyperlipidemia LDL goal <70   . Hypertension   . Morbid obesity (HCC)   . STEMI (ST elevation myocardial infarction) (HCC)    4/19 PCI/DES x1 to D2.  . Tobacco abuse    Past Surgical History:  Procedure Laterality Date  . CARDIAC CATHETERIZATION    . CORONARY/GRAFT ACUTE MI REVASCULARIZATION N/A 04/04/2018   Procedure: Coronary/Graft Acute MI Revascularization;  Surgeon: Yvonne Kendall, MD;  Location: [facility];  Service: Cardiovascular;  Laterality: N/A;  . INTRAVASCULAR PRESSURE WIRE/FFR STUDY N/A  09/14/2019   Procedure: INTRAVASCULAR PRESSURE WIRE/FFR STUDY;  Surgeon: Yvonne Kendall, MD;  [facility]: [facility];  Service: Cardiovascular;  Laterality: N/A;  . LEFT HEART CATH AND CORONARY ANGIOGRAPHY N/A 04/04/2018   Procedure: LEFT HEART CATH AND CORONARY ANGIOGRAPHY;  Surgeon: Yvonne Kendall, MD;  [facility]: [facility];  Service: Cardiovascular;  Laterality: N/A;  . LEFT HEART CATH AND CORONARY ANGIOGRAPHY N/A 09/14/2019   Procedure: LEFT HEART CATH AND CORONARY ANGIOGRAPHY;  Surgeon: Yvonne Kendall, MD;  [facility]: [facility];  Service: Cardiovascular;  Laterality: N/A;    Current Meds  Medication Sig  . atorvastatin (LIPITOR) 40 MG tablet TAKE 1 TABLET BY MOUTH EVERY DAY  . calcium carbonate (TUMS - DOSED IN MG ELEMENTAL CALCIUM) 500 MG chewable tablet Chew 1 tablet by mouth daily as needed for indigestion or heartburn.  . carvedilol (COREG) 12.5 MG tablet TAKE 1 TABLET BY MOUTH TWICE A DAY WITH MEALS  . clopidogrel (PLAVIX) 75 MG tablet TAKE 1 TABLET BY MOUTH EVERY DAY  . furosemide (LASIX) 20 MG tablet TAKE 1 TABLET BY MOUTH EVERY DAY AS NEEDED FOR WEIGHT GAIN AND SWELLING  . isosorbide mononitrate (IMDUR) 30 MG 24 hr tablet TAKE 1/2 TABLET BY MOUTH EVERY DAY  . lisinopril (ZESTRIL) 2.5 MG tablet TAKE 1 TABLET BY MOUTH EVERY DAY  . omeprazole (PRILOSEC) 40 MG capsule Take 40 mg by mouth daily.  [name] Phenylephrine-APAP-guaiFENesin (TYLENOL SINUS SEVERE) 5-325-200 MG TABS Take 1 tablet by mouth daily as needed (allergies).  . tamsulosin (FLOMAX) 0.4 MG CAPS capsule Take 0.4 mg by mouth every evening.   . [DISCONTINUED] nitroGLYCERIN (NITROSTAT) 0.4 MG SL tablet Place 1 tablet (0.4 mg  total) under the tongue every 5 (five) minutes as needed.    Allergies: Patient has no known allergies.  Social History   Tobacco Use  . Smoking status: Former Smoker    Packs/day: 0.50    Types: Cigarettes    Quit date: 09/04/2019    Years since quitting: 1.6  . Smokeless  tobacco: Never Used  Vaping Use  . Vaping Use: Never used  Substance Use Topics  . Alcohol use: Not Currently  . Drug use: Never    Family History  Problem Relation Age of Onset  . Cancer Mother     Review of Systems: A 12-system review of systems was performed and was negative except as noted in the HPI.  --------------------------------------------------------------------------------------------------  Physical Exam: BP 110/76 (BP Location: Left Arm, Patient Position: Sitting, Cuff Size: Normal)   Pulse 78   Ht 5\' 11"  (1.803 m)   Wt (!) 338 lb (153.3 kg)   SpO2 96%   BMI 47.14 kg/m   General:  NAD. Neck: No JVD or HJR, though body habitus limits evaluation. Lungs: Clear to auscultation bilaterally without wheezes or crackles. Heart: Regular rate and rhythm without murmurs, rubs, or gallops. Abdomen: Soft, nontender, nondistended. Extremities: Trace pretibial edema.  EKG:  Normal sinus rhythm with low voltage.  Otherwise, no significant abnormality.  Lab Results  Component Value Date   WBC 6.4 09/11/2020   HGB 15.6 09/11/2020   HCT 46.7 09/11/2020   MCV 94 09/11/2020   PLT 132 (L) 09/11/2020    Lab Results  Component Value Date   NA 139 09/11/2020   K 4.3 09/11/2020   CL 104 09/11/2020   CO2 20 09/11/2020   BUN 23 09/11/2020   CREATININE 1.26 09/11/2020   GLUCOSE 119 (H) 09/11/2020   ALT 22 09/11/2020    Lab Results  Component Value Date   CHOL 96 (L) 01/05/2020   HDL 32 (L) 01/05/2020   LDLCALC 39 01/05/2020   TRIG 144 01/05/2020   CHOLHDL 3.0 01/05/2020    --------------------------------------------------------------------------------------------------  ASSESSMENT AND PLAN: Coronary artery disease with stable angina: Mr. Teal is doing well without recurrent angina following PCI to diagonal in the setting of STEMI in 03/2018 and moderate LAD noted on repeat catheterization in 2020.  We will continue his current antianginal regimen carvedilol  and isosorbide mononitrate, as well as secondary prevention with atorvastatin and clopidogrel.  Hypertension: Blood pressure well-controlled.  Continue current doses of carvedilol and lisinopril.  Hyperlipidemia: Mr. Dilauro reports recent labs through his PCP.  We will request results for our records with plans to continue atorvastatin 40 mg daily for target LDL < 70.  Chronic HFpEF: Mr. Forrester reports NYHA class I symptoms and is grossly euvolemic other than trace ankle edema.  We will continue furosemide 20 mg daily.  Morbid obesity: BMP remains > 40.  Weight loss encouraged through diet and exercise.  Follow-up: Return to clinic in 1 year.   Yetta Barre, MD 04/25/2021 2:31 PM

## 2021-04-23 NOTE — Patient Instructions (Signed)
Medication Instructions:  Your physician recommends that you continue on your current medications as directed. Please refer to the Current Medication list given to you today.   A refill for Nitro-glycerin has been sent to your pharmacy.  *If you need a refill on your cardiac medications before your next appointment, please call your pharmacy*   Lab Work: We will request your recent labs from your primary care.  If you have labs (blood work) drawn today and your tests are completely normal, you will receive your results only by: [name] MyChart Message (if you have MyChart) OR . A paper copy in the mail If you have any lab test that is abnormal or we need to change your treatment, we will call you to review the results.   Testing/Procedures: None ordered   Follow-Up: At [facility], you and your health needs are our priority.  As part of our continuing mission to provide you with exceptional heart care, we have created designated Provider Care Teams.  These Care Teams include your primary Cardiologist (physician) and Advanced Practice Providers (APPs -  Physician Assistants and Nurse Practitioners) who all work together to provide you with the care you need, when you need it.  We recommend signing up for the patient portal called "MyChart".  Sign up information is provided on this After Visit Summary.  MyChart is used to connect with patients for Virtual Visits (Telemedicine).  Patients are able to view lab/test results, encounter notes, upcoming appointments, etc.  Non-urgent messages can be sent to your provider as well.   To learn more about what you can do with MyChart, go to [email or web address].    Your next appointment:   Your physician wants you to follow-up in: 1 year You will receive a reminder letter in the mail two months in advance. If you don't receive a letter, please call our office to schedule the follow-up appointment.   The format for your next appointment:   In  Person  Provider:   You may see Yvonne Kendall, MD or one of the following Advanced Practice Providers on your designated Care Team:    Nicolasa Ducking, NP  Eula Listen, PA-C  Marisue Ivan, PA-C  C[place], [place]  Gillian Shields, NP    Other Instructions N/A

## 2021-04-24 ENCOUNTER — Telehealth: Payer: Self-pay | Admitting: Internal Medicine

## 2021-04-24 NOTE — Telephone Encounter (Signed)
Made in error

## 2021-04-25 ENCOUNTER — Encounter: Payer: Self-pay | Admitting: Internal Medicine

## 2021-05-14 ENCOUNTER — Other Ambulatory Visit: Payer: Self-pay | Admitting: Internal Medicine

## 2021-05-22 ENCOUNTER — Other Ambulatory Visit: Payer: Self-pay | Admitting: Internal Medicine

## 2021-06-23 ENCOUNTER — Other Ambulatory Visit: Payer: Self-pay | Admitting: Internal Medicine

## 2021-07-13 ENCOUNTER — Other Ambulatory Visit: Payer: Self-pay | Admitting: Internal Medicine

## 2021-08-14 ENCOUNTER — Other Ambulatory Visit: Payer: Self-pay | Admitting: Internal Medicine

## 2022-03-16 ENCOUNTER — Other Ambulatory Visit: Payer: Self-pay | Admitting: Internal Medicine

## 2022-05-13 ENCOUNTER — Encounter: Payer: Self-pay | Admitting: Gastroenterology

## 2022-06-11 ENCOUNTER — Encounter: Payer: Self-pay | Admitting: *Deleted

## 2022-06-12 NOTE — Progress Notes (Deleted)
Cardiology Office Note    Date:  06/12/2022   ID:  Brodan, Grewell 1966/10/05, MRN [id number]  PCP:  Nonnie Done., MD  Cardiologist:  Yvonne Kendall, MD  Electrophysiologist:  None   Chief Complaint: Follow-up  History of Present Illness:   JHAN CONERY is a 56 y.o. male with history of CAD with lateral STEMI in 03/2018, chronic combined systolic and diastolic CHF, ICM, HTN, HLD, mild OSA, tobacco use, obesity, and GERD who presents for follow-up of CAD.  He was admitted to the hospital in 03/2018 with a lateral ST EMI.  LHC demonstrated acute plaque rupture and 100% thrombotic occlusion of a dominant D2 branch with otherwise moderate, nonobstructive disease involving the proximal and mid LAD.  He underwent PCI/DES to the D2.  Echo demonstrated a normal LV cavity size with moderate concentric hypertrophy, mildly dilated left atrium, mildly dilated right ventricle.  EF and wall motion was not able to be assessed.  Limited echo in 09/2018 demonstrated a low normal LV systolic function with an EF of 50 to 55%, unable to exclude regional wall motion abnormalities, mild concentric LVH, mild mitral regurgitation, normal RV systolic function and PASP.  He underwent Lexiscan MPI in 07/2019 which was abnormal, demonstrating a moderate in size, severe, fixed defect involving the mid anterior, apical anterior, apical lateral, and apical segments consistent with scar.  There was no evidence of significant ischemia.  LVEF was moderately depressed at 30 to 44%.  CT attenuated corrected images showed mild aortic atherosclerosis.  Overall, this was an intermediate risk study.  Due to ongoing symptoms concerning for angina, he underwent LHC on 09/10/2019 which showed mild to moderate, nonobstructive CAD including 60% mid LAD stenosis that improved angiographically with intracoronary nitroglycerin and was not hemodynamically significant with a DFR of 0.9-0.91.  There was also 40% ostial D2 stenosis just  before the previously placed stent, as well as mild disease involving the proximal LAD and distal RCA.  The previously placed D2 stent was widely patent.  Mildly to moderately reduced LV systolic function with an EF of approximately 45% with global hypokinesis.  Escalation of medical therapy was recommended.  He was last seen in the office in 04/2021 and was without symptoms of angina or decompensation.  He remains very active at work.  ***   Labs independently reviewed: 03/2021 - TC 101, TG 104, HDL 32, LDL 49, BUN 26, serum creatinine 1.27, albumin 4.3, AST/ALT normal 08/2020 - Hgb 15.6, PLT 132  Past Medical History:  Diagnosis Date   (HFpEF) heart failure with preserved ejection fraction (HCC)    CKD (chronic kidney disease), stage III (HCC)    Coronary artery disease    a. 03/2018 Lateral STEMI/PCI: LMnl, LAD 20p, 37m, D2 100ost (2.25x12 Resolute Onyx DES), LCX large/nl, OM1/2 large/nl, RCA large, min irregs, RPDA/RPAV large/nl.   GERD (gastroesophageal reflux disease)    Hyperlipidemia LDL goal <70    Hypertension    Morbid obesity (HCC)    STEMI (ST elevation myocardial infarction) (HCC)    4/19 PCI/DES x1 to D2.   Tobacco abuse     Past Surgical History:  Procedure Laterality Date   CARDIAC CATHETERIZATION     CORONARY/GRAFT ACUTE MI REVASCULARIZATION N/A 04/04/2018   Procedure: Coronary/Graft Acute MI Revascularization;  Surgeon: Yvonne Kendall, MD;  Location: [facility];  Service: Cardiovascular;  Laterality: N/A;   INTRAVASCULAR PRESSURE WIRE/FFR STUDY N/A 09/14/2019   Procedure: INTRAVASCULAR PRESSURE WIRE/FFR STUDY;  Surgeon: End,  Cristal Deer, MD;  Location: [facility];  Service: Cardiovascular;  Laterality: N/A;   LEFT HEART CATH AND CORONARY ANGIOGRAPHY N/A 04/04/2018   Procedure: LEFT HEART CATH AND CORONARY ANGIOGRAPHY;  Surgeon: Yvonne Kendall, MD;  Location: [facility];  Service: Cardiovascular;  Laterality: N/A;   LEFT HEART CATH AND CORONARY  ANGIOGRAPHY N/A 09/14/2019   Procedure: LEFT HEART CATH AND CORONARY ANGIOGRAPHY;  Surgeon: Yvonne Kendall, MD;  Location: [facility];  Service: Cardiovascular;  Laterality: N/A;    Current Medications: No outpatient medications have been marked as taking for the 06/15/22 encounter (Appointment) with Sondra Barges, PA-C.    Allergies:   Patient has no known allergies.   Social History   Socioeconomic History   Marital status: Single    Spouse name: Not on file   Number of children: Not on file   Years of education: Not on file   Highest education level: Not on file  Occupational History   Not on file  Tobacco Use   Smoking status: Former    Packs/day: 0.50    Types: Cigarettes    Quit date: 09/04/2019    Years since quitting: 2.7   Smokeless tobacco: Never  Vaping Use   Vaping Use: Never used  Substance and Sexual Activity   Alcohol use: Not Currently   Drug use: Never   Sexual activity: Not on file  Other Topics Concern   Not on file  Social History Narrative   Not on file   Social Determinants of Health   Financial Resource Strain: Not on file  Food Insecurity: Not on file  Transportation Needs: Unknown (04/05/2018)   PRAPARE - Transportation    Lack of Transportation (Medical): Patient refused    Lack of Transportation (Non-Medical): Patient refused  Physical Activity: Not on file  Stress: Not on file  Social Connections: Unknown (04/05/2018)   Social Connection and Isolation Panel [NHANES]    Frequency of Communication with Friends and Family: Not asked    Frequency of Social Gatherings with Friends and Family: Not asked    Attends Religious Services: Not asked    Active Member of Clubs or Organizations: Not asked    Attends Banker Meetings: Not asked    Marital Status: Not asked     Family History:  The patient's family history includes Cancer in his mother.  ROS:   ROS   EKGs/Labs/Other Studies Reviewed:    Studies reviewed  were summarized above. The additional studies were reviewed today:  LHC 09/14/2019: Conclusions: Mild to moderate, non-obstructive coronary artery disease including 60% mid LAD stenosis that improves angiographically with intracoronary nitroglycerin and is not hemodynamically significant (DFR 0.90-0.91).  There is also 40% ostial D2 stenosis just before previously placed stent, as well as mild disease involving the proximal LAD and distal RCA. Widely patent D2 stent. Mildly to moderately left ventricular systolic function (LVEF ~45%) with global hypokinesis.  Normal left ventricular filling pressure.   Recommendations: Continue medical therapy, including high-intensity statin therapy and dual antiplatelet therapy. Consider escalation of isosorbide mononitrate as BP and headache allow if chest pain continues. Consider adding ACEI/ARB at follow if renal function is stable and BP tolerates, given mildly to moderately reduced LVEF most consistent with nonischemic cardiomyopathy. Follow-up with me or an APP in ~2 weeks. __________  Eugenie Birks MPI 08/15/2019: Abnromal pharmacologic myocardial perfusion stress test. There is a moderate in size, severe, fixed defect involving the mid anterior, apical anterior, apical lateral, and  apical segments consistent with scar. There is no evidence of significant ischemia. The left ventricular ejection fraction is moderately decreased (30-44%). Mild aortic atherosclerosis is noted on the attenuation correction CT. This is an intermediate risk study. __________  Limited echo 09/22/2018: - Left ventricle: The cavity size was normal. There was mild    concentric hypertrophy. Systolic function was normal. The    estimated ejection fraction was in the range of 50% to 55%.    Regional wall motion abnormalities cannot be excluded. The study    is not technically sufficient to allow evaluation of LV diastolic    function.  - Mitral valve: There was mild  regurgitation.  - Right ventricle: Systolic function was normal.  - Pulmonary arteries: Systolic pressure was within the normal    range. ___________  2D echo 04/05/2018: - Left ventricle: The cavity size was normal. There was moderate    concentric hypertrophy. The study is not technically sufficient    to allow evaluation of LV diastolic function.  - Left atrium: The atrium was mildly dilated.  - Right ventricle: The cavity size was mildly dilated. Wall    thickness was normal.   Recommendations:  Limited definity contrast study to accurately  assess EF and wall motion. __________  [facility] 04/04/2018: Conclusions: Lateral STEMI due to acute plaque rupture and 100% thrombotic occlusion of dominant diagonal branch (D2). Moderate, non-obstructive coronary artery disease involving the proximal and mid LAD. Mildly elevated left ventricular filling pressure.   Recommendations: Dual antiplatelet therapy with aspirin and ticagrelor for at least 12 months. Aggressive secondary prevention, including high-intensity statin therapy. Obtain echocardiogram to evaluate LVEF.   EKG:  EKG is ordered today.  The EKG ordered today demonstrates ***  Recent Labs: No results found for requested labs within last 365 days.  Recent Lipid Panel    Component Value Date/Time   CHOL 96 (L) 01/05/2020 1028   TRIG 144 01/05/2020 1028   HDL 32 (L) 01/05/2020 1028   CHOLHDL 3.0 01/05/2020 1028   CHOLHDL 2.1 06/30/2018 0349   VLDL 10 06/30/2018 0349   LDLCALC 39 01/05/2020 1028    PHYSICAL EXAM:    VS:  There were no vitals taken for this visit.  BMI: There is no height or weight on file to calculate BMI.  Physical Exam  Wt Readings from Last 3 Encounters:  04/23/21 (!) 338 lb (153.3 kg)  09/11/20 (!) 336 lb 2 oz (152.5 kg)  01/05/20 (!) 327 lb 8 oz (148.6 kg)     ASSESSMENT & PLAN:   CAD involving the native coronary arteries without***angina:  Chronic combined systolic and diastolic  CHF/ICM:  HTN: Blood pressure  HLD: LDL 49 in 03/2021.   {Are you ordering a CV Procedure (e.g. stress test, cath, DCCV, TEE, etc)?   Press F2        :[id number]     Disposition: F/u with Dr. Saunders Revel or an APP in ***.   Medication Adjustments/Labs and Tests Ordered: Current medicines are reviewed at length with the patient today.  Concerns regarding medicines are outlined above. Medication changes, Labs and Tests ordered today are summarized above and listed in the Patient Instructions accessible in Encounters.   Signed, Christell Faith, PA-C 06/12/2022 8:54 AM     [place] [address] [place], [place] [address] [phone]

## 2022-06-15 ENCOUNTER — Ambulatory Visit: Payer: BC Managed Care – PPO | Admitting: Physician Assistant

## 2022-07-08 ENCOUNTER — Other Ambulatory Visit: Payer: Self-pay | Admitting: Internal Medicine

## 2022-07-11 ENCOUNTER — Other Ambulatory Visit: Payer: Self-pay | Admitting: Internal Medicine

## 2022-07-23 NOTE — Progress Notes (Signed)
Cardiology Office Note    Date:  07/24/2022   ID:  [name], [name] 1966-03-11, MRN [id number]  PCP:  Nonnie Done., MD  Cardiologist:  Yvonne Kendall, MD  Electrophysiologist:  None   Chief Complaint: Follow-up  History of Present Illness:   [name] Schmidt is a 56 y.o. male with history of CAD with lateral STEMI on 04/04/2018 status post PCI/DES to D2, HFpEF, HTN, HLD, tobacco use, mild OSA, and obesity who presents for follow-up of his CAD.  He was admitted to the hospital in 03/2018 with a lateral ST elevation MI.  LHC showed acute plaque rupture and 100% thrombotic occlusion of a dominant diagonal branch (D2).  Otherwise, there was moderate, nonobstructive CAD involving the proximal and mid LAD with mildly elevated left ventricular filling pressure.  He underwent successful PCI/DES to the D2.  Echo during that admission was inconclusive in evaluating EF or wall motion.  Subsequent outpatient echo in 09/2018, with Definity, demonstrated an EF of 50 to 55%, mild concentric LVH, mild mitral regurgitation, and normal RV systolic function and PASP.  He underwent Lexiscan MPI in 07/2019 due to chest pain, which showed a moderate in size, severe, fixed defect involving the mid anterior, apical anterior, apical lateral, and apical segments consistent with scar without evidence of significant ischemia.  LVEF of 30 to 44%.  Overall, this was an intermediate risk study.  Given ongoing symptoms, he underwent LHC on 09/10/2019 which showed mild to moderate, nonobstructive CAD including 60% mid LAD stenosis that improved angiographically with intracoronary nitroglycerin and was not hemodynamically significant with a DFR of 0.90-0.91.  There was also 40% ostial D2 stenosis just before the previously placed stent as well as mild disease involving the proximal LAD and distal RCA.  The previously placed D2 stent was widely patent.  LVEF estimated at approximately 45% with global hypokinesis with normal LV  filling pressure.  Escalation of medical therapy was recommended.  He was most recently seen in the office in 04/2021 and was without symptoms of angina or decompensation.  He comes in doing well from a cardiac perspective, and is without symptoms of angina or decompensation.  No dyspnea, palpitations, dizziness, presyncope, or syncope.  No significant lower extremity swelling or orthopnea.  No early satiety.  No falls, hematochezia, or melena.  He is adherent and tolerating cardiac medications without issues.  He does not have any active cardiac issues or concerns at this time.   Labs independently reviewed: 03/2021 - TC 101, TG 104, HDL 32, LDL 49, BUN 26, serum creatinine 1.27, potassium 4.2, albumin 4.3, AST/ALT normal 08/2020 - Hgb 15.6, PLT 132  Past Medical History:  Diagnosis Date   (HFpEF) heart failure with preserved ejection fraction (HCC)    CKD (chronic kidney disease), stage III (HCC)    Coronary artery disease    a. 03/2018 Lateral STEMI/PCI: LMnl, LAD 20p, 6m, D2 100ost (2.25x12 Resolute Onyx DES), LCX large/nl, OM1/2 large/nl, RCA large, min irregs, RPDA/RPAV large/nl.   GERD (gastroesophageal reflux disease)    Hyperlipidemia LDL goal <70    Hypertension    Morbid obesity (HCC)    STEMI (ST elevation myocardial infarction) (HCC)    4/19 PCI/DES x1 to D2.   Tobacco abuse     Past Surgical History:  Procedure Laterality Date   CARDIAC CATHETERIZATION     CORONARY/GRAFT ACUTE MI REVASCULARIZATION N/A 04/04/2018   Procedure: Coronary/Graft Acute MI Revascularization;  Surgeon: Yvonne Kendall, MD;  Location: [facility];  Service: Cardiovascular;  Laterality: N/A;   INTRAVASCULAR PRESSURE WIRE/FFR STUDY N/A 09/14/2019   Procedure: INTRAVASCULAR PRESSURE WIRE/FFR STUDY;  Surgeon: Nelva Bush, MD;  Location: [facility];  Service: Cardiovascular;  Laterality: N/A;   LEFT HEART CATH AND CORONARY ANGIOGRAPHY N/A 04/04/2018   Procedure: LEFT HEART CATH AND  CORONARY ANGIOGRAPHY;  Surgeon: Nelva Bush, MD;  Location: [facility];  Service: Cardiovascular;  Laterality: N/A;   LEFT HEART CATH AND CORONARY ANGIOGRAPHY N/A 09/14/2019   Procedure: LEFT HEART CATH AND CORONARY ANGIOGRAPHY;  Surgeon: Nelva Bush, MD;  Location: [facility];  Service: Cardiovascular;  Laterality: N/A;    Current Medications: Current Meds  Medication Sig   atorvastatin (LIPITOR) 40 MG tablet TAKE 1 TABLET BY MOUTH EVERY DAY   calcium carbonate (TUMS - DOSED IN MG ELEMENTAL CALCIUM) 500 MG chewable tablet Chew 1 tablet by mouth daily as needed for indigestion or heartburn.   carvedilol (COREG) 12.5 MG tablet TAKE 1 TABLET BY MOUTH TWICE A DAY WITH MEALS   furosemide (LASIX) 20 MG tablet TAKE 1 TABLET BY MOUTH EVERY DAY AS NEEDED FOR WEIGHT GAIN AND SWELLING   isosorbide mononitrate (IMDUR) 30 MG 24 hr tablet TAKE 1/2 TABLET BY MOUTH EVERY DAY   lisinopril (ZESTRIL) 2.5 MG tablet TAKE 1 TABLET BY MOUTH EVERY DAY   nitroGLYCERIN (NITROSTAT) 0.4 MG SL tablet Place 1 tablet (0.4 mg total) under the tongue every 5 (five) minutes as needed.   omeprazole (PRILOSEC) 40 MG capsule Take 40 mg by mouth daily.   Phenylephrine-APAP-guaiFENesin (TYLENOL SINUS SEVERE) 5-325-200 MG TABS Take 1 tablet by mouth daily as needed (allergies).   tamsulosin (FLOMAX) 0.4 MG CAPS capsule Take 0.4 mg by mouth every evening.    [DISCONTINUED] clopidogrel (PLAVIX) 75 MG tablet TAKE 1 TABLET BY MOUTH EVERY DAY    Allergies:   Patient has no known allergies.   Social History   Socioeconomic History   Marital status: Single    Spouse name: Not on file   Number of children: Not on file   Years of education: Not on file   Highest education level: Not on file  Occupational History   Not on file  Tobacco Use   Smoking status: Every Day    Packs/day: 0.50    Types: Cigarettes    Last attempt to quit: 09/04/2019    Years since quitting: 2.8   Smokeless tobacco: Never   Vaping Use   Vaping Use: Never used  Substance and Sexual Activity   Alcohol use: Not Currently   Drug use: Never   Sexual activity: Not on file  Other Topics Concern   Not on file  Social History Narrative   Not on file   Social Determinants of Health   Financial Resource Strain: Not on file  Food Insecurity: Not on file  Transportation Needs: Unknown (04/05/2018)   PRAPARE - Transportation    Lack of Transportation (Medical): Patient refused    Lack of Transportation (Non-Medical): Patient refused  Physical Activity: Not on file  Stress: Not on file  Social Connections: Unknown (04/05/2018)   Social Connection and Isolation Panel [NHANES]    Frequency of Communication with Friends and Family: Not asked    Frequency of Social Gatherings with Friends and Family: Not asked    Attends Religious Services: Not asked    Active Member of Clubs or Organizations: Not asked    Attends Archivist Meetings: Not asked  Marital Status: Not asked     Family History:  The patient's family history includes Cancer in his mother.  ROS:   12-point review of systems is negative unless otherwise noted in the HPI.   EKGs/Labs/Other Studies Reviewed:    Studies reviewed were summarized above. The additional studies were reviewed today:  LHC 09/14/2019: Conclusions: Mild to moderate, non-obstructive coronary artery disease including 60% mid LAD stenosis that improves angiographically with intracoronary nitroglycerin and is not hemodynamically significant (DFR 0.90-0.91).  There is also 40% ostial D2 stenosis just before previously placed stent, as well as mild disease involving the proximal LAD and distal RCA. Widely patent D2 stent. Mildly to moderately left ventricular systolic function (LVEF ~45%) with global hypokinesis.  Normal left ventricular filling pressure.   Recommendations: Continue medical therapy, including high-intensity statin therapy and dual antiplatelet  therapy. Consider escalation of isosorbide mononitrate as BP and headache allow if chest pain continues. Consider adding ACEI/ARB at follow if renal function is stable and BP tolerates, given mildly to moderately reduced LVEF most consistent with nonischemic cardiomyopathy. Follow-up with me or an APP in ~2 weeks. __________  Eugenie Birks MPI 08/15/2019: Abnromal pharmacologic myocardial perfusion stress test. There is a moderate in size, severe, fixed defect involving the mid anterior, apical anterior, apical lateral, and apical segments consistent with scar. There is no evidence of significant ischemia. The left ventricular ejection fraction is moderately decreased (30-44%). Mild aortic atherosclerosis is noted on the attenuation correction CT. This is an intermediate risk study. __________  Limited echo 09/22/2018: - Left ventricle: The cavity size was normal. There was mild    concentric hypertrophy. Systolic function was normal. The    estimated ejection fraction was in the range of 50% to 55%.    Regional wall motion abnormalities cannot be excluded. The study    is not technically sufficient to allow evaluation of LV diastolic    function.  - Mitral valve: There was mild regurgitation.  - Right ventricle: Systolic function was normal.  - Pulmonary arteries: Systolic pressure was within the normal    range. ___________  2D echo 04/05/2018: - Left ventricle: The cavity size was normal. There was moderate    concentric hypertrophy. The study is not technically sufficient    to allow evaluation of LV diastolic function.  - Left atrium: The atrium was mildly dilated.  - Right ventricle: The cavity size was mildly dilated. Wall    thickness was normal.   Recommendations:  Limited definity contrast study to accurately  assess EF and wall motion. __________  [facility] 04/04/2018: Conclusions: Lateral STEMI due to acute plaque rupture and 100% thrombotic occlusion of dominant diagonal  branch (D2). Moderate, non-obstructive coronary artery disease involving the proximal and mid LAD. Mildly elevated left ventricular filling pressure.   Recommendations: Dual antiplatelet therapy with aspirin and ticagrelor for at least 12 months. Aggressive secondary prevention, including high-intensity statin therapy. Obtain echocardiogram to evaluate LVEF.   EKG:  EKG is ordered today.  The EKG ordered today demonstrates NSR, 79 bpm, low voltage QRS, no acute ST-T changes, no significant change when compared to prior tracing  Recent Labs: No results found for requested labs within last 365 days.  Recent Lipid Panel    Component Value Date/Time   CHOL 96 (L) 01/05/2020 1028   TRIG 144 01/05/2020 1028   HDL 32 (L) 01/05/2020 1028   CHOLHDL 3.0 01/05/2020 1028   CHOLHDL 2.1 06/30/2018 0349   VLDL 10 06/30/2018 0349   LDLCALC 39  01/05/2020 1028    PHYSICAL EXAM:    VS:  BP 110/80 (BP Location: Left Arm, Patient Position: Sitting, Cuff Size: Large)   Pulse 79   Ht 5\' 11"  (1.803 m)   Wt (!) 335 lb 2 oz (152 kg)   SpO2 98%   BMI 46.74 kg/m   BMI: Body mass index is 46.74 kg/m.  Physical Exam Vitals reviewed.  Constitutional:      Appearance: He is well-developed.  HENT:     Head: Normocephalic and atraumatic.  Eyes:     General:        Right eye: No discharge.        Left eye: No discharge.  Cardiovascular:     Rate and Rhythm: Normal rate and regular rhythm.     Pulses:          Posterior tibial pulses are 2+ on the right side and 2+ on the left side.     Heart sounds: Normal heart sounds, S1 normal and S2 normal. Heart sounds not distant. No midsystolic click and no opening snap. No murmur heard.    No friction rub.  Pulmonary:     Effort: Pulmonary effort is normal. No respiratory distress.     Breath sounds: Normal breath sounds. No decreased breath sounds, wheezing or rales.  Chest:     Chest wall: No tenderness.  Abdominal:     General: There is no  distension.  Musculoskeletal:     Cervical back: Normal range of motion.     Right lower leg: No edema.     Left lower leg: No edema.  Skin:    General: Skin is warm and dry.     Nails: There is no clubbing.  Neurological:     Mental Status: He is alert and oriented to person, place, and time.  Psychiatric:        Speech: Speech normal.        Behavior: Behavior normal.        Thought Content: Thought content normal.        Judgment: Judgment normal.     Wt Readings from Last 3 Encounters:  07/24/22 (!) 335 lb 2 oz (152 kg)  04/23/21 (!) 338 lb (153.3 kg)  09/11/20 (!) 336 lb 2 oz (152.5 kg)     ASSESSMENT & PLAN:   CAD involving the native coronary arteries without angina: He continues to do very well without symptoms concerning for angina or decompensation following PCI to the diagonal in 03/2018.  Continue aggressive resector modification and current medical therapy for ongoing management of CAD including clopidogrel, atorvastatin, carvedilol, isosorbide mononitrate, and as needed SL NTG.  No indication for further ischemic testing at this time.  Check CBC.  HFpEF: He appears euvolemic and well compensated with NYHA class I symptoms.  He remains on low-dose furosemide.  Check CMP.  HTN: Blood pressure is well controlled.  Continue current medical therapy as outlined above.  HLD: LDL 49 from 03/2021.  Check CMP, lipid panel, and direct LDL.  He remains on atorvastatin 40 mg.    Disposition: F/u with Dr. Saunders Revel or an APP in 12 months.   Medication Adjustments/Labs and Tests Ordered: Current medicines are reviewed at length with the patient today.  Concerns regarding medicines are outlined above. Medication changes, Labs and Tests ordered today are summarized above and listed in the Patient Instructions accessible in Encounters.   SignedChristell Faith, PA-C 07/24/2022 3:58 PM     [facility] -  [address] [place], [place] [address] [phone]

## 2022-07-24 ENCOUNTER — Ambulatory Visit (INDEPENDENT_AMBULATORY_CARE_PROVIDER_SITE_OTHER): Payer: BC Managed Care – PPO | Admitting: Physician Assistant

## 2022-07-24 ENCOUNTER — Other Ambulatory Visit
Admission: RE | Admit: 2022-07-24 | Discharge: 2022-07-24 | Disposition: A | Discharge status: Home / Self Care | Payer: BC Managed Care – PPO | Source: Ambulatory Visit | Attending: Physician Assistant | Admitting: Physician Assistant

## 2022-07-24 ENCOUNTER — Encounter: Payer: Self-pay | Admitting: Physician Assistant

## 2022-07-24 VITALS — BP 110/80 | HR 79 | Ht 71.0 in | Wt 335.1 lb

## 2022-07-24 DIAGNOSIS — E785 Hyperlipidemia, unspecified: Secondary | ICD-10-CM | POA: Diagnosis not present

## 2022-07-24 DIAGNOSIS — I10 Essential (primary) hypertension: Secondary | ICD-10-CM | POA: Diagnosis not present

## 2022-07-24 DIAGNOSIS — I2510 Atherosclerotic heart disease of native coronary artery without angina pectoris: Secondary | ICD-10-CM | POA: Diagnosis not present

## 2022-07-24 DIAGNOSIS — I5032 Chronic diastolic (congestive) heart failure: Secondary | ICD-10-CM

## 2022-07-24 LAB — COMPREHENSIVE METABOLIC PANEL
ALT: 26 U/L (ref 0–44)
AST: 21 U/L (ref 15–41)
Albumin: 4.3 g/dL (ref 3.5–5.0)
Alkaline Phosphatase: 164 U/L — ABNORMAL HIGH (ref 38–126)
Anion gap: 8 (ref 5–15)
BUN: 28 mg/dL — ABNORMAL HIGH (ref 6–20)
CO2: 25 mmol/L (ref 22–32)
Calcium: 9.1 mg/dL (ref 8.9–10.3)
Chloride: 106 mmol/L (ref 98–111)
Creatinine, Ser: 1.23 mg/dL (ref 0.61–1.24)
GFR, Estimated: >60 mL/min (ref >60)
Glucose, Bld: 110 mg/dL — ABNORMAL HIGH (ref 70–99)
Potassium: 4 mmol/L (ref 3.5–5.1)
Sodium: 139 mmol/L (ref 135–145)
Total Bilirubin: 1 mg/dL (ref 0.3–1.2)
Total Protein: 8 g/dL (ref 6.5–8.1)

## 2022-07-24 LAB — CBC
HCT: 46.7 % (ref 39.0–52.0)
Hemoglobin: 16 g/dL (ref 13.0–17.0)
MCH: 31.5 pg (ref 26.0–34.0)
MCHC: 34.3 g/dL (ref 30.0–36.0)
MCV: 91.9 fL (ref 80.0–100.0)
Platelets: 125 10*3/uL — ABNORMAL LOW (ref 150–400)
RBC: 5.08 MIL/uL (ref 4.22–5.81)
RDW: 12.1 % (ref 11.5–15.5)
WBC: 7.1 10*3/uL (ref 4.0–10.5)
nRBC: 0 % (ref 0.0–0.2)

## 2022-07-24 LAB — LDL CHOLESTEROL, DIRECT: Direct LDL: 52 mg/dL (ref 0–99)

## 2022-07-24 LAB — LIPID PANEL
Cholesterol: 89 mg/dL (ref 0–200)
HDL: 28 mg/dL — ABNORMAL LOW (ref >40)
LDL Cholesterol: 42 mg/dL (ref 0–99)
Total CHOL/HDL Ratio: 3.2 RATIO
Triglycerides: 93 mg/dL (ref <150)
VLDL: 19 mg/dL (ref 0–40)

## 2022-07-24 MED ORDER — CLOPIDOGREL BISULFATE 75 MG PO TABS: 75.0000 mg | ORAL_TABLET | Freq: Every day | ORAL | 3 refills | Status: DC

## 2022-07-24 NOTE — Patient Instructions (Signed)
Medication Instructions:  No changes at this time.   *If you need a refill on your cardiac medications before your next appointment, please call your pharmacy*   Lab Work: CMET, Lipid, Direct LDL, and CBC over at the [facility] and check in at registration.   If you have labs (blood work) drawn today and your tests are completely normal, you will receive your results only by: MyChart Message (if you have MyChart) OR A paper copy in the mail If you have any lab test that is abnormal or we need to change your treatment, we will call you to review the results.   Testing/Procedures: None   Follow-Up: At [facility], you and your health needs are our priority.  As part of our continuing mission to provide you with exceptional heart care, we have created designated Provider Care Teams.  These Care Teams include your primary Cardiologist (physician) and Advanced Practice Providers (APPs -  Physician Assistants and Nurse Practitioners) who all work together to provide you with the care you need, when you need it.   Your next appointment:   1 year(s)  The format for your next appointment:   In Person  Provider:   Yvonne Kendall, MD or Eula Listen, PA-C        Important Information About Sugar

## 2022-07-27 NOTE — Addendum Note (Signed)
Addended by: Kendrick Fries on: 07/27/2022 03:14 PM   Modules accepted: Orders

## 2022-08-02 ENCOUNTER — Other Ambulatory Visit: Payer: Self-pay | Admitting: Internal Medicine

## 2022-08-04 ENCOUNTER — Telehealth: Payer: Self-pay | Admitting: Internal Medicine

## 2022-08-04 MED ORDER — PANTOPRAZOLE SODIUM 40 MG PO TBEC: 40.0000 mg | DELAYED_RELEASE_TABLET | Freq: Every day | ORAL | 11 refills | Status: DC

## 2022-08-04 NOTE — Telephone Encounter (Signed)
Pt c/o medication issue:  1. Name of Medication: clopidogrel (PLAVIX) 75 MG tablet omeprazole (PRILOSEC) 40 MG capsule  2. How are you currently taking this medication (dosage and times per day)?   3. Are you having a reaction (difficulty breathing--STAT)?   4. What is your medication issue? [facility] is calling requesting call back to get clarification on these two meds pt is prescribed. They would like to know if you are aware of the interactions between the two.

## 2022-08-04 NOTE — Telephone Encounter (Signed)
Discussed the below with Cadence Fransico Michael, PA-C in office. New verbal orders received to discontinue omeprazole and start pantoprazole 40 mg daily with refills.   Pt notified and pt voiced understanding.   Called and notified [facility] as well.   Omeprazole discontinued and new Rx sent for pantoprazole 40 mg daily.   Pt has no further questions at this time.

## 2022-08-27 ENCOUNTER — Other Ambulatory Visit: Payer: Self-pay | Admitting: Internal Medicine

## 2022-08-29 ENCOUNTER — Other Ambulatory Visit: Payer: Self-pay | Admitting: Internal Medicine

## 2022-09-21 ENCOUNTER — Other Ambulatory Visit: Payer: Self-pay | Admitting: Internal Medicine

## 2022-11-02 ENCOUNTER — Other Ambulatory Visit: Payer: Self-pay | Admitting: Internal Medicine

## 2022-11-20 HISTORY — PX: OTHER SURGICAL HISTORY: SHX169

## 2022-11-23 ENCOUNTER — Other Ambulatory Visit: Payer: Self-pay | Admitting: Physician Assistant

## 2023-02-11 ENCOUNTER — Other Ambulatory Visit: Payer: Self-pay | Admitting: Internal Medicine

## 2023-05-23 ENCOUNTER — Other Ambulatory Visit: Payer: Self-pay | Admitting: Physician Assistant

## 2023-05-23 ENCOUNTER — Other Ambulatory Visit: Payer: Self-pay | Admitting: Medical

## 2023-05-23 ENCOUNTER — Other Ambulatory Visit: Payer: Self-pay | Admitting: Internal Medicine

## 2023-06-16 ENCOUNTER — Other Ambulatory Visit: Payer: Self-pay | Admitting: Internal Medicine

## 2023-06-16 ENCOUNTER — Other Ambulatory Visit: Payer: Self-pay | Admitting: Physician Assistant

## 2023-06-18 ENCOUNTER — Other Ambulatory Visit: Payer: Self-pay | Admitting: Internal Medicine

## 2023-08-09 NOTE — Progress Notes (Unsigned)
Cardiology Office Note    Date:  08/10/2023   ID:  [name], [name] [birth year], MRN [id number]  PCP:  Nonnie Done., MD  Cardiologist:  Yvonne Kendall, MD  Electrophysiologist:  None   Chief Complaint: Follow-up  History of Present Illness:   [name] Schmidt is a 57 y.o. male with history of CAD with lateral STEMI on 04/04/2018 status post PCI/DES to D2, HFpEF, HTN, HLD, tobacco use, mild OSA, and obesity who presents for follow-up of his CAD.   He was admitted to the hospital in 03/2018 with a lateral ST elevation MI.  LHC showed acute plaque rupture and 100% thrombotic occlusion of a dominant diagonal branch (D2).  Otherwise, there was moderate, nonobstructive CAD involving the proximal and mid LAD with mildly elevated left ventricular filling pressure.  He underwent successful PCI/DES to the D2.  Echo during that admission was inconclusive in evaluating EF or wall motion.  Subsequent outpatient echo in 09/2018, with Definity, demonstrated an EF of 50 to 55%, mild concentric LVH, mild mitral regurgitation, and normal RV systolic function and PASP.  He underwent Lexiscan MPI in 07/2019 due to chest pain, which showed a moderate in size, severe, fixed defect involving the mid anterior, apical anterior, apical lateral, and apical segments consistent with scar without evidence of significant ischemia.  LVEF of 30 to 44%.  Overall, this was an intermediate risk study.  Given ongoing symptoms, he underwent LHC on 09/10/2019 which showed mild to moderate, nonobstructive CAD including 60% mid LAD stenosis that improved angiographically with intracoronary nitroglycerin and was not hemodynamically significant with a DFR of 0.90-0.91.  There was also 40% ostial D2 stenosis just before the previously placed stent as well as mild disease involving the proximal LAD and distal RCA.  The previously placed D2 stent was widely patent.  LVEF estimated at approximately 45% with global hypokinesis with normal LV  filling pressure.  Escalation of medical therapy was recommended.  He was most recently seen in the office in 07/2022 and was without symptoms of angina or decompensation.  He comes in doing well from a cardiac perspective and is without symptoms of angina or cardiac decompensation.  No palpitations, dizziness, presyncope, or syncope.  No falls, hematochezia, or melena.  He does need a refill of SL NTG as he washed his bottle with his laundry.  His weight is down 29 pounds today when compared to his visit from last year, this is intentional with walking regimen and changes to diet.  He does inquire about GLP-1 agonist therapy.  No patient or family history of thyroid medullary cancer or endocrine neoplasia syndrome type II.  Otherwise, he does not have any cardiac issues or concerns at this time.   Labs independently reviewed: 07/2023 - potassium 4.0, BUN 28, serum creatinine 1.23, albumin 4.3, AST/ALT normal, TC 89, TG 93, HDL 28, LDL 42, Hgb 16.0, PLT 125  Past Medical History:  Diagnosis Date   (HFpEF) heart failure with preserved ejection fraction (HCC)    CKD (chronic kidney disease), stage III (HCC)    Coronary artery disease    a. 03/2018 Lateral STEMI/PCI: LMnl, LAD 20p, 57m, D2 100ost (2.25x12 Resolute Onyx DES), LCX large/nl, OM1/2 large/nl, RCA large, min irregs, RPDA/RPAV large/nl.   GERD (gastroesophageal reflux disease)    Hyperlipidemia LDL goal <70    Hypertension    Morbid obesity (HCC)    STEMI (ST elevation myocardial infarction) (HCC)    4/19 PCI/DES x1 to D2.  Tobacco abuse     Past Surgical History:  Procedure Laterality Date   CARDIAC CATHETERIZATION     CORONARY PRESSURE/FFR STUDY N/A 09/14/2019   Procedure: INTRAVASCULAR PRESSURE WIRE/FFR STUDY;  Surgeon: Yvonne Kendall, MD;  Location: [facility];  Service: Cardiovascular;  Laterality: N/A;   CORONARY/GRAFT ACUTE MI REVASCULARIZATION N/A 04/04/2018   Procedure: Coronary/Graft Acute MI Revascularization;   Surgeon: Yvonne Kendall, MD;  Location: [facility];  Service: Cardiovascular;  Laterality: N/A;   dental implants  11/2022   LEFT HEART CATH AND CORONARY ANGIOGRAPHY N/A 04/04/2018   Procedure: LEFT HEART CATH AND CORONARY ANGIOGRAPHY;  Surgeon: Yvonne Kendall, MD;  Location: [facility];  Service: Cardiovascular;  Laterality: N/A;   LEFT HEART CATH AND CORONARY ANGIOGRAPHY N/A 09/14/2019   Procedure: LEFT HEART CATH AND CORONARY ANGIOGRAPHY;  Surgeon: Yvonne Kendall, MD;  Location: [facility];  Service: Cardiovascular;  Laterality: N/A;    Current Medications: Current Meds  Medication Sig   atorvastatin (LIPITOR) 40 MG tablet TAKE 1 TABLET (40 MG TOTAL) BY MOUTH DAILY. PLEASE CALL [phone] TO SCHEDULE YEARLY VISIT AND FOR FURTHER REFILLS. THANK YOU.   carvedilol (COREG) 12.5 MG tablet TAKE 1 TABLET BY MOUTH TWICE A DAY WITH FOOD   clopidogrel (PLAVIX) 75 MG tablet Take 1 tablet (75 mg total) by mouth daily.   furosemide (LASIX) 20 MG tablet TAKE 1 TABLET BY MOUTH EVERY DAY AS NEEDED FOR WEIGHT GAIN AND SWELLING   isosorbide mononitrate (IMDUR) 30 MG 24 hr tablet TAKE 1/2 TABLET BY MOUTH DAILY   lisinopril (ZESTRIL) 2.5 MG tablet TAKE 1 TABLET BY MOUTH EVERY DAY   nitroGLYCERIN (NITROSTAT) 0.4 MG SL tablet Place 1 tablet (0.4 mg total) under the tongue every 5 (five) minutes as needed.   pantoprazole (PROTONIX) 40 MG tablet TAKE 1 TABLET BY MOUTH EVERY DAY   tamsulosin (FLOMAX) 0.4 MG CAPS capsule Take 0.4 mg by mouth every evening.    tirzepatide (ZEPBOUND) 2.5 MG/0.5ML Pen Inject 2.5 mg into the skin once a week. Inject once weekly for 4 weeks. Call office for dosage increase.   [DISCONTINUED] tirzepatide (ZEPBOUND) 5 MG/0.5ML Pen Inject 5 mg into the skin once a week. for 4 weeks    Allergies:   Patient has no known allergies.   Social History   Socioeconomic History   Marital status: Single    Spouse name: Not on file   Number of children: Not on  file   Years of education: Not on file   Highest education level: Not on file  Occupational History   Not on file  Tobacco Use   Smoking status: Every Day    Current packs/day: 0.00    Types: Cigarettes    Last attempt to quit: 09/04/2019    Years since quitting: 3.9   Smokeless tobacco: Never  Vaping Use   Vaping status: Never Used  Substance and Sexual Activity   Alcohol use: Not Currently   Drug use: Never   Sexual activity: Not on file  Other Topics Concern   Not on file  Social History Narrative   Not on file   Social Determinants of Health   Financial Resource Strain: Not on file  Food Insecurity: Not on file  Transportation Needs: Unknown (04/05/2018)   PRAPARE - Administrator, Civil Service (Medical): Patient declined    Lack of Transportation (Non-Medical): Patient declined  Physical Activity: Not on file  Stress: Not on file  Social  Connections: Unknown (04/05/2018)   Social Connection and Isolation Panel [NHANES]    Frequency of Communication with Friends and Family: Not asked    Frequency of Social Gatherings with Friends and Family: Not asked    Attends Religious Services: Not asked    Active Member of Clubs or Organizations: Not asked    Attends Banker Meetings: Not asked    Marital Status: Not asked     Family History:  The patient's family history includes Cancer in his mother.  ROS:   12-point review of systems is negative unless otherwise noted in the HPI.   EKGs/Labs/Other Studies Reviewed:    Studies reviewed were summarized above. The additional studies were reviewed today:  LHC 09/14/2019: Conclusions: Mild to moderate, non-obstructive coronary artery disease including 60% mid LAD stenosis that improves angiographically with intracoronary nitroglycerin and is not hemodynamically significant (DFR 0.90-0.91).  There is also 40% ostial D2 stenosis just before previously placed stent, as well as mild disease involving the  proximal LAD and distal RCA. Widely patent D2 stent. Mildly to moderately left ventricular systolic function (LVEF ~45%) with global hypokinesis.  Normal left ventricular filling pressure.   Recommendations: Continue medical therapy, including high-intensity statin therapy and dual antiplatelet therapy. Consider escalation of isosorbide mononitrate as BP and headache allow if chest pain continues. Consider adding ACEI/ARB at follow if renal function is stable and BP tolerates, given mildly to moderately reduced LVEF most consistent with nonischemic cardiomyopathy. Follow-up with me or an APP in ~2 weeks. __________   Eugenie Birks MPI 08/15/2019: Abnromal pharmacologic myocardial perfusion stress test. There is a moderate in size, severe, fixed defect involving the mid anterior, apical anterior, apical lateral, and apical segments consistent with scar. There is no evidence of significant ischemia. The left ventricular ejection fraction is moderately decreased (30-44%). Mild aortic atherosclerosis is noted on the attenuation correction CT. This is an intermediate risk study. __________   Limited echo 09/22/2018: - Left ventricle: The cavity size was normal. There was mild    concentric hypertrophy. Systolic function was normal. The    estimated ejection fraction was in the range of 50% to 55%.    Regional wall motion abnormalities cannot be excluded. The study    is not technically sufficient to allow evaluation of LV diastolic    function.  - Mitral valve: There was mild regurgitation.  - Right ventricle: Systolic function was normal.  - Pulmonary arteries: Systolic pressure was within the normal    range. ___________   2D echo 04/05/2018: - Left ventricle: The cavity size was normal. There was moderate    concentric hypertrophy. The study is not technically sufficient    to allow evaluation of LV diastolic function.  - Left atrium: The atrium was mildly dilated.  - Right ventricle: The  cavity size was mildly dilated. Wall    thickness was normal.   Recommendations:  Limited definity contrast study to accurately  assess EF and wall motion. __________   [facility] 04/04/2018: Conclusions: Lateral STEMI due to acute plaque rupture and 100% thrombotic occlusion of dominant diagonal branch (D2). Moderate, non-obstructive coronary artery disease involving the proximal and mid LAD. Mildly elevated left ventricular filling pressure.   Recommendations: Dual antiplatelet therapy with aspirin and ticagrelor for at least 12 months. Aggressive secondary prevention, including high-intensity statin therapy. Obtain echocardiogram to evaluate LVEF.   EKG:  EKG is ordered today.  The EKG ordered today demonstrates NSR, 72 bpm, possible prior septal infarct vs lead placement, no  acute st/t changes  Recent Labs: No results found for requested labs within last 365 days.  Recent Lipid Panel    Component Value Date/Time   CHOL 89 07/24/2022 1607   CHOL 96 (L) 01/05/2020 1028   TRIG 93 07/24/2022 1607   HDL 28 (L) 07/24/2022 1607   HDL 32 (L) 01/05/2020 1028   CHOLHDL 3.2 07/24/2022 1607   VLDL 19 07/24/2022 1607   LDLCALC 42 07/24/2022 1607   LDLCALC 39 01/05/2020 1028   LDLDIRECT 52 07/24/2022 1607    PHYSICAL EXAM:    VS:  BP 118/80 (BP Location: Left Arm, Patient Position: Sitting, Cuff Size: Large)   Pulse 72   Ht 5\' 11"  (1.803 m)   Wt (!) 306 lb (138.8 kg)   SpO2 97%   BMI 42.68 kg/m   BMI: Body mass index is 42.68 kg/m.  Physical Exam Vitals reviewed.  Constitutional:      Appearance: He is well-developed.  HENT:     Head: Normocephalic and atraumatic.  Eyes:     General:        Right eye: No discharge.        Left eye: No discharge.  Neck:     Vascular: No JVD.  Cardiovascular:     Rate and Rhythm: Normal rate and regular rhythm.     Pulses:          Dorsalis pedis pulses are 2+ on the right side and 2+ on the left side.       Posterior tibial pulses are 2+  on the right side and 2+ on the left side.     Heart sounds: Normal heart sounds, S1 normal and S2 normal. Heart sounds not distant. No midsystolic click and no opening snap. No murmur heard.    No friction rub.  Pulmonary:     Effort: Pulmonary effort is normal. No respiratory distress.     Breath sounds: Normal breath sounds. No decreased breath sounds, wheezing or rales.  Chest:     Chest wall: No tenderness.  Abdominal:     General: There is no distension.  Musculoskeletal:     Cervical back: Normal range of motion.     Right lower leg: No edema.     Left lower leg: No edema.  Skin:    General: Skin is warm and dry.     Nails: There is no clubbing.  Neurological:     Mental Status: He is alert and oriented to person, place, and time.  Psychiatric:        Speech: Speech normal.        Behavior: Behavior normal.        Thought Content: Thought content normal.        Judgment: Judgment normal.     Wt Readings from Last 3 Encounters:  08/10/23 (!) 306 lb (138.8 kg)  07/24/22 (!) 335 lb 2 oz (152 kg)  04/23/21 (!) 338 lb (153.3 kg)     ASSESSMENT & PLAN:   CAD involving the native coronary arteries without angina: He is doing very without symptoms concerning for angina or cardiac decompensation.  Continue aggressive risk factor modification and secondary prevention including clopidogrel, atorvastatin, Imdur, and as needed SL NTG.  No indication for ischemic testing at this time.  HFpEF: Euvolemic and well compensated.  Very sparingly needs as needed furosemide.  Continue optimal blood pressure control.  HTN: Blood pressure is well-controlled in the office.  He remains on carvedilol, isosorbide mononitrate, and lisinopril.  HLD: LDL 42 from 07/2023 with normal AST/ALT at that time.  He remains on atorvastatin 40 mg.  Obesity: Weight is down 29 pounds today when compared to his last visit, he indicates this is intentional through walking regimen and lifestyle modification.   However, weight has plateaued in the low 300s.  He is interested in GLP-1 agonist therapy.  No patient or family history of multiple endocrine neoplasia type II or medullary thyroid cancer.  Trial of Zepbound.   Disposition: F/u with Dr. Okey Dupre or an APP in 12 month.   Medication Adjustments/Labs and Tests Ordered: Current medicines are reviewed at length with the patient today.  Concerns regarding medicines are outlined above. Medication changes, Labs and Tests ordered today are summarized above and listed in the Patient Instructions accessible in Encounters.   Signed, Eula Listen, PA-C 08/10/2023 9:59 AM     [facility] [address] [place], [place] [address] [phone]

## 2023-08-10 ENCOUNTER — Encounter: Payer: Self-pay | Admitting: Physician Assistant

## 2023-08-10 ENCOUNTER — Ambulatory Visit: Payer: BC Managed Care – PPO | Attending: Physician Assistant | Admitting: Physician Assistant

## 2023-08-10 ENCOUNTER — Other Ambulatory Visit: Payer: Self-pay | Admitting: Physician Assistant

## 2023-08-10 VITALS — BP 118/80 | HR 72 | Ht 71.0 in | Wt 306.0 lb

## 2023-08-10 DIAGNOSIS — I10 Essential (primary) hypertension: Secondary | ICD-10-CM | POA: Diagnosis not present

## 2023-08-10 DIAGNOSIS — E785 Hyperlipidemia, unspecified: Secondary | ICD-10-CM

## 2023-08-10 DIAGNOSIS — Z6841 Body Mass Index (BMI) 40.0 and over, adult: Secondary | ICD-10-CM

## 2023-08-10 DIAGNOSIS — I2510 Atherosclerotic heart disease of native coronary artery without angina pectoris: Secondary | ICD-10-CM | POA: Diagnosis not present

## 2023-08-10 DIAGNOSIS — I5032 Chronic diastolic (congestive) heart failure: Secondary | ICD-10-CM

## 2023-08-10 DIAGNOSIS — E66813 Obesity, class 3: Secondary | ICD-10-CM

## 2023-08-10 MED ORDER — ZEPBOUND 5 MG/0.5ML ~~LOC~~ SOAJ: 5.0000 mg | SUBCUTANEOUS | 0 refills | Status: DC

## 2023-08-10 MED ORDER — ZEPBOUND 2.5 MG/0.5ML ~~LOC~~ SOAJ: 2.5000 mg | SUBCUTANEOUS | 0 refills | Status: DC

## 2023-08-10 NOTE — Patient Instructions (Signed)
Medication Instructions:  Your physician recommends the following medication changes.  START TAKING: Zepbound: Inject 5mg /0.5 mg into the skin once a week  *If you need a refill on your cardiac medications before your next appointment, please call your pharmacy*   Lab Work: No labs ordered today    Testing/Procedures: No test ordered today    Follow-Up: At [facility], you and your health needs are our priority.  As part of our continuing mission to provide you with exceptional heart care, we have created designated Provider Care Teams.  These Care Teams include your primary Cardiologist (physician) and Advanced Practice Providers (APPs -  Physician Assistants and Nurse Practitioners) who all work together to provide you with the care you need, when you need it.  We recommend signing up for the patient portal called "MyChart".  Sign up information is provided on this After Visit Summary.  MyChart is used to connect with patients for Virtual Visits (Telemedicine).  Patients are able to view lab/test results, encounter notes, upcoming appointments, etc.  Non-urgent messages can be sent to your provider as well.   To learn more about what you can do with MyChart, go to [email or web address].    Your next appointment:   1 year(s)  Provider:   You may see Yvonne Kendall, MD or one of the following Advanced Practice Providers on your designated Care Team:   Nicolasa Ducking, NP Eula Listen, PA-C Cadence Fransico Michael, PA-C Charlsie Quest, NP

## 2023-08-11 ENCOUNTER — Telehealth: Payer: Self-pay | Admitting: Physician Assistant

## 2023-08-11 ENCOUNTER — Other Ambulatory Visit: Payer: Self-pay | Admitting: Internal Medicine

## 2023-08-11 NOTE — Telephone Encounter (Signed)
Pt c/o medication issue:  1. Name of Medication:   tirzepatide (ZEPBOUND) 2.5 MG/0.5ML Pen    2. How are you currently taking this medication (dosage and times per day)?  Inject 2.5 mg into the skin once a week. Inject once weekly for 4 weeks. Call office for dosage increase.     3. Are you having a reaction (difficulty breathing--STAT)? No  4. What is your medication issue? Pt is requesting a callback to discuss other options regarding this medication. Please advise

## 2023-08-12 ENCOUNTER — Other Ambulatory Visit (HOSPITAL_COMMUNITY): Payer: Self-pay

## 2023-08-12 NOTE — Telephone Encounter (Signed)
Please have him reach out to his insurance company to see if they cover any of the other brands i.e. Ozempic, Mounjaro, or Wegovy.

## 2023-08-12 NOTE — Telephone Encounter (Signed)
Pharmacy Patient Advocate Encounter   Received notification from Physician's Office that prior authorization for ZEPBOUND is required/requested.   Insurance verification completed.   The patient is insured through [facility] .   Per test claim: The current 28 day co-pay is, $1017.  No PA needed at this time. This test claim was processed through [facility]- copay amounts may vary at other pharmacies due to pharmacy/plan contracts, or as the patient moves through the different stages of their insurance plan.    Wegovy no PA required but cost is high $1295 Ozempic requires PA (approval is usually limited to patients with diabetes on this plan) PA required for [facility] (cost would likely also be high)

## 2023-08-16 NOTE — Telephone Encounter (Signed)
Please notify patient of the below costs.  It appears GLP-1 agonist will likely be cost prohibitive.

## 2023-08-19 ENCOUNTER — Other Ambulatory Visit: Payer: Self-pay | Admitting: Internal Medicine

## 2023-08-19 MED ORDER — NITROGLYCERIN 0.4 MG SL SUBL: 0.4000 mg | SUBLINGUAL_TABLET | SUBLINGUAL | 2 refills | Status: AC | PRN

## 2023-08-19 NOTE — Telephone Encounter (Signed)
Spoke with patient and he did agree that he is not able to afford that medication. He did request refill of his nitroglycerin due to washing it in the washing machine. Sent that in and he had no further questions at this time. Removed Zepbound from his medication list.

## 2023-08-19 NOTE — Telephone Encounter (Signed)
Rx request sent to pharmacy.  

## 2023-09-07 ENCOUNTER — Other Ambulatory Visit: Payer: Self-pay | Admitting: Physician Assistant

## 2023-09-18 ENCOUNTER — Other Ambulatory Visit: Payer: Self-pay | Admitting: Internal Medicine

## 2023-12-18 ENCOUNTER — Other Ambulatory Visit: Payer: Self-pay | Admitting: Physician Assistant

## 2024-03-15 ENCOUNTER — Telehealth: Payer: Self-pay | Admitting: Internal Medicine

## 2024-03-15 MED ORDER — CLOPIDOGREL BISULFATE 75 MG PO TABS: 75.0000 mg | ORAL_TABLET | Freq: Every day | ORAL | 1 refills | Status: DC

## 2024-03-15 NOTE — Telephone Encounter (Signed)
*  STAT* If patient is at the pharmacy, call can be transferred to refill team.   1. Which medications need to be refilled? (please list name of each medication and dose if known)   clopidogrel (PLAVIX) 75 MG tablet    2. Which pharmacy/location (including street and city if local pharmacy) is medication to be sent to?  [id number] [place], [place] - [address]      3. Do they need a 30 day or 90 day supply? 90 day   Pt is out of medication.

## 2024-06-13 ENCOUNTER — Other Ambulatory Visit: Payer: Self-pay

## 2024-06-13 MED ORDER — PANTOPRAZOLE SODIUM 40 MG PO TBEC: 40.0000 mg | DELAYED_RELEASE_TABLET | Freq: Every day | ORAL | 0 refills | Status: DC

## 2024-07-23 ENCOUNTER — Other Ambulatory Visit: Payer: Self-pay | Admitting: Internal Medicine

## 2024-08-09 ENCOUNTER — Ambulatory Visit: Attending: Internal Medicine | Admitting: Internal Medicine

## 2024-08-09 ENCOUNTER — Encounter: Payer: Self-pay | Admitting: Internal Medicine

## 2024-08-09 VITALS — BP 103/73 | HR 88 | Ht 71.0 in | Wt 280.0 lb

## 2024-08-09 DIAGNOSIS — I10 Essential (primary) hypertension: Secondary | ICD-10-CM

## 2024-08-09 DIAGNOSIS — E785 Hyperlipidemia, unspecified: Secondary | ICD-10-CM

## 2024-08-09 DIAGNOSIS — I5032 Chronic diastolic (congestive) heart failure: Secondary | ICD-10-CM

## 2024-08-09 DIAGNOSIS — I25118 Atherosclerotic heart disease of native coronary artery with other forms of angina pectoris: Secondary | ICD-10-CM

## 2024-08-09 DIAGNOSIS — R42 Dizziness and giddiness: Secondary | ICD-10-CM

## 2024-08-09 DIAGNOSIS — I2510 Atherosclerotic heart disease of native coronary artery without angina pectoris: Secondary | ICD-10-CM

## 2024-08-09 MED ORDER — FUROSEMIDE 20 MG PO TABS: 20.0000 mg | ORAL_TABLET | ORAL | 1 refills | Status: DC | PRN

## 2024-08-09 NOTE — Progress Notes (Unsigned)
 Cardiology Office Note:  .   Date:  08/10/2024  ID:  [name] Schmidt, DOB September 17, 1966, MRN [id number] PCP: [name] Norleen PARAS., MD  [facility] Providers Cardiologist:  [name] Hanson, MD     History of Present Illness: .   [name] Schmidt is a 58 y.o. male with history of CAD with lateral STEMI on 04/04/2018 status post PCI/DES to D2, HFpEF, HTN, HLD, tobacco use, mild OSA, and obesity, who presents for follow-up of coronary artery disease.  He was last seen in our office a year ago by [name] Bring, PA, at which time he was feeling well.  His weight was down 29 pounds over the preceding year.  Tirzepatide  was prescribed to assist with weight loss, though this ultimately proved cost prohibitive.  In spite of this, Mr. Roblero has continued to lose weight through diet and exercise.  Today, his main complaint is of frequent lightheadedness.  It was mild a year ago but has continued to worsen as his weight has declined.  He has not checked his blood pressure regularly at home.  He has not passed out or fallen.  He denies chest pain, shortness of breath, or palpitations.  He is currently using as needed furosemide  once or twice a week, usually when he notices some puffiness in his hands.  ROS: See HPI  Studies Reviewed: SABRA   EKG Interpretation Date/Time:  Wednesday August 09 2024 11:14:16 EDT Ventricular Rate:  79 PR Interval:  178 QRS Duration:  82 QT Interval:  358 QTC Calculation: 410 R Axis:   -8  Text Interpretation: Normal sinus rhythm Low voltage QRS Septal infarct (cited on or before 10-Aug-2023) Abnormal ECG When compared with ECG of 10-Aug-2023 08:33, No significant change was found Confirmed by Moxon Messler, [name] [phone]) on 08/10/2024 5:16:51 PM    Risk Assessment/Calculations:             Physical Exam:   VS:  BP 103/73 (BP Location: Left Arm, Patient Position: Sitting, Cuff Size: Large)   Pulse 88   Ht 5' 11 (1.803 m)   Wt 280 lb (127 kg)   SpO2 99%   BMI 39.05 kg/m    Wt  Readings from Last 3 Encounters:  08/09/24 280 lb (127 kg)  08/10/23 (!) 306 lb (138.8 kg)  07/24/22 (!) 335 lb 2 oz (152 kg)    General:  NAD. Neck: No JVD or HJR. Lungs: Clear to auscultation bilaterally without wheezes or crackles. Heart: Regular rate and rhythm without murmurs, rubs, or gallops. Abdomen: Soft, nontender, nondistended. Extremities: No lower extremity edema.  ASSESSMENT AND PLAN: .    Coronary artery disease with stable angina: No recurrent angina reported.  Continue secondary prevention with atorvastatin  and clopidogrel .  With quiescent symptoms and soft blood pressure accompanying lightheadedness, we have agreed to discontinue isosorbide  mononitrate.  Continue carvedilol  for antianginal therapy.  Chronic HFpEF: Mr. Elms appears euvolemic with only intermittent mild edema that responds to as needed furosemide .  I encouraged him to remain adequately hydrated given lightheadedness, though he can continue to use his as needed furosemide  for edema/weight gain.  Lightheadedness and essential hypertension: Worsening lightheadedness noted with soft blood pressure seen today.  I suspect that in the setting of his weight loss, Mr. Longest does not need such aggressive antihypertensive therapy.  We have agreed to discontinue isosorbide  mononitrate and lisinopril .  I have encouraged him to remain well-hydrated, though he can continue to use his as needed furosemide  for breakthrough edema.  I encouraged  him to monitor his blood pressure intermittently at home.  I will also check a CBC to ensure that he has not become anemic in the setting of his chronic clopidogrel  use.  Hyperlipidemia: Lipids well-controlled on last check through PCPs office (LDL 50 in 04/2024).  Continue atorvastatin  40 mg daily.    Dispo: Return to clinic in 3 months.  Signed, [name] Hanson, MD

## 2024-08-09 NOTE — Patient Instructions (Signed)
 Medication Instructions:  Your physician recommends the following medication changes.  STOP TAKING: Imdur  Lisinopril   *If you need a refill on your cardiac medications before your next appointment, please call your pharmacy*  Lab Work: Your provider would like for you to have following labs drawn today CBC.   If you have labs (blood work) drawn today and your tests are completely normal, you will receive your results only by: MyChart Message (if you have MyChart) OR A paper copy in the mail If you have any lab test that is abnormal or we need to change your treatment, we will call you to review the results.  Testing/Procedures: No test ordered today   Follow-Up: At [facility], you and your health needs are our priority.  As part of our continuing mission to provide you with exceptional heart care, our providers are all part of one team.  This team includes your primary Cardiologist (physician) and Advanced Practice Providers or APPs (Physician Assistants and Nurse Practitioners) who all work together to provide you with the care you need, when you need it.  Your next appointment:   3 month(s)  Provider:   You may see Lonni Hanson, MD or one of the following Advanced Practice Providers on your designated Care Team:   Lonni Meager, NP Lesley Maffucci, PA-C Bernardino Bring, PA-C Cadence [place], PA-C Tylene Lunch, NP Barnie Hila, NP

## 2024-08-10 ENCOUNTER — Encounter: Payer: Self-pay | Admitting: Internal Medicine

## 2024-08-10 DIAGNOSIS — R42 Dizziness and giddiness: Secondary | ICD-10-CM | POA: Insufficient documentation

## 2024-08-10 LAB — CBC
Hematocrit: 51.2 % — ABNORMAL HIGH (ref 37.5–51.0)
Hemoglobin: 16.9 g/dL (ref 13.0–17.7)
MCH: 31.3 pg (ref 26.6–33.0)
MCHC: 33 g/dL (ref 31.5–35.7)
MCV: 95 fL (ref 79–97)
Platelets: 148 x10E3/uL — ABNORMAL LOW (ref 150–450)
RBC: 5.4 x10E6/uL (ref 4.14–5.80)
RDW: 12.4 % (ref 11.6–15.4)
WBC: 8 x10E3/uL (ref 3.4–10.8)

## 2024-08-11 ENCOUNTER — Ambulatory Visit: Payer: Self-pay | Admitting: Internal Medicine

## 2024-09-05 ENCOUNTER — Other Ambulatory Visit: Payer: Self-pay | Admitting: Internal Medicine

## 2024-10-20 ENCOUNTER — Other Ambulatory Visit: Payer: Self-pay | Admitting: Internal Medicine

## 2024-11-10 ENCOUNTER — Ambulatory Visit: Attending: Internal Medicine | Admitting: Internal Medicine

## 2024-11-10 ENCOUNTER — Encounter: Payer: Self-pay | Admitting: Internal Medicine

## 2024-11-10 VITALS — BP 100/74 | HR 75 | Ht 71.0 in | Wt 264.0 lb

## 2024-11-10 DIAGNOSIS — I10 Essential (primary) hypertension: Secondary | ICD-10-CM

## 2024-11-10 DIAGNOSIS — E785 Hyperlipidemia, unspecified: Secondary | ICD-10-CM

## 2024-11-10 DIAGNOSIS — I5032 Chronic diastolic (congestive) heart failure: Secondary | ICD-10-CM | POA: Diagnosis not present

## 2024-11-10 DIAGNOSIS — I25118 Atherosclerotic heart disease of native coronary artery with other forms of angina pectoris: Secondary | ICD-10-CM | POA: Diagnosis not present

## 2024-11-10 MED ORDER — CARVEDILOL 6.25 MG PO TABS: 6.2500 mg | ORAL_TABLET | Freq: Two times a day (BID) | ORAL | 3 refills | Status: AC

## 2024-11-10 NOTE — Progress Notes (Unsigned)
  Cardiology Office Note:  .   Date:  11/11/2024  ID:  [name] Schmidt, DOB 01-16-1966, MRN [id number] PCP: Sabas Norleen PARAS., MD  [facility] Providers Cardiologist:  Lonni Hanson, MD     History of Present Illness: .   [name] Schmidt is a 58 y.o. male with history of CAD with lateral STEMI on 04/04/2018 status post PCI/DES to D2, HFpEF, HTN, HLD, tobacco use, mild OSA, and obesity, who presents for follow-up of coronary artery disease.  I last saw him in August, at which time he noted significant weight loss through lifestyle modifications.  He complained of frequent lightheadedness and was noted to have a low normal blood pressure at our visit.  It was felt that weight loss may have potentiated the effects of his blood pressure medications; we agreed to stop isosorbide  mononitrate and lisinopril .  Today, [name] Schmidt reports that his previous lightheadedness has improved after discontinuation of lisinopril  and isosorbide  mononitrate.  However, he still has occasional orthostatic lightheadedness.  He has not passed out or fallen.  He also denies chest pain, shortness of breath, and palpitations.  He is using his as needed furosemide  3-4 times per week based on his weight and puffiness that he notices in his fingers.  He continues to lose weight through exercise and dietary changes (particularly having cut out sugary soft drinks).  ROS: See HPI  Studies Reviewed: .       Risk Assessment/Calculations:             Physical Exam:   VS:  BP 100/74 (BP Location: Left Arm, Patient Position: Sitting, Cuff Size: Large)   Pulse 75   Ht 5' 11 (1.803 m)   Wt 264 lb (119.7 kg)   SpO2 98%   BMI 36.82 kg/m    Wt Readings from Last 3 Encounters:  11/10/24 264 lb (119.7 kg)  08/09/24 280 lb (127 kg)  08/10/23 (!) 306 lb (138.8 kg)    General:  NAD. Neck: No JVD or HJR. Lungs: Clear to auscultation bilaterally without wheezes or crackles. Heart: Regular rate and rhythm without murmurs,  rubs, or gallops. Abdomen: Soft, nontender, nondistended. Extremities: No lower extremity edema.  ASSESSMENT AND PLAN: .    Coronary artery disease with stable angina: [name] Schmidt continues to do well without any recurrent angina despite discontinuation of isosorbide  mononitrate at our last visit.  Given that he has soft blood pressure again today with intermittent lightheadedness, we will reduce carvedilol  to 6.25 mg twice daily.  Continue atorvastatin  and clopidogrel  for secondary prevention.  Chronic HFpEF: [name] Schmidt appears euvolemic today, though he is having to use furosemide  3-4 times a week for weight gain and puffiness.  I think it is reasonable to continue with this, though I counseled him against becoming dehydrated, as this could exacerbate his orthostatic lightheadedness.  Essential hypertension: Blood pressure borderline low today following discontinuation of lisinopril  and isosorbide  mononitrate at our last visit.  I suspect that Mr. Shiflet' continued weight loss is contributing to his very well-controlled blood hypertension.  As above, we will reduce carvedilol  to 6.25 mg twice daily.  Hyperlipidemia: Lipids well-controlled on last check through PCPs office.  Continue atorvastatin  40 mg daily.    Dispo: Return to clinic in 1 year.  Signed, Lonni Hanson, MD

## 2024-11-10 NOTE — Patient Instructions (Addendum)
 Medication Instructions:  Your physician recommends the following medication changes.   DECREASE: Carvedilol  to 6.25 mg twice a day with meals.    Lab Work: No labs ordered today    Testing/Procedures: No test ordered today   Follow-Up: At [facility], you and your health needs are our priority.  As part of our continuing mission to provide you with exceptional heart care, our providers are all part of one team.  This team includes your primary Cardiologist (physician) and Advanced Practice Providers or APPs (Physician Assistants and Nurse Practitioners) who all work together to provide you with the care you need, when you need it.  Your next appointment:   1 year(s)  Provider:   Lonni Hanson, MD

## 2024-11-11 ENCOUNTER — Encounter: Payer: Self-pay | Admitting: Internal Medicine

## 2024-12-03 ENCOUNTER — Other Ambulatory Visit: Payer: Self-pay | Admitting: Physician Assistant

## 2025-01-25 ENCOUNTER — Other Ambulatory Visit: Payer: Self-pay | Admitting: Internal Medicine
